# Patient Record
Sex: Male | Born: 1965
Health system: Southern US, Community
[De-identification: ages and names within clinical notes are randomized; demographics above are authoritative.]

## PROBLEM LIST (undated history)

## (undated) DIAGNOSIS — E785 Hyperlipidemia, unspecified: Secondary | ICD-10-CM

## (undated) DIAGNOSIS — I10 Essential (primary) hypertension: Secondary | ICD-10-CM

## (undated) DIAGNOSIS — E669 Obesity, unspecified: Secondary | ICD-10-CM

## (undated) HISTORY — DX: Essential (primary) hypertension: I10

## (undated) HISTORY — DX: Hyperlipidemia, unspecified: E78.5

## (undated) HISTORY — PX: TRIGGER FINGER RELEASE: SHX641

## (undated) HISTORY — PX: KNEE SURGERY: SHX244

## (undated) HISTORY — DX: Obesity, unspecified: E66.9

## (undated) HISTORY — PX: CARPAL TUNNEL RELEASE: SHX101

---

## 2013-06-17 ENCOUNTER — Ambulatory Visit (INDEPENDENT_AMBULATORY_CARE_PROVIDER_SITE_OTHER): Payer: BC Managed Care – PPO | Admitting: Cardiovascular Disease

## 2013-06-17 ENCOUNTER — Encounter: Payer: Self-pay | Admitting: *Deleted

## 2013-06-17 ENCOUNTER — Encounter: Payer: Self-pay | Admitting: Cardiovascular Disease

## 2013-06-17 ENCOUNTER — Other Ambulatory Visit: Payer: Self-pay | Admitting: *Deleted

## 2013-06-17 ENCOUNTER — Encounter (INDEPENDENT_AMBULATORY_CARE_PROVIDER_SITE_OTHER): Payer: Self-pay

## 2013-06-17 VITALS — BP 161/95 | HR 99 | Ht 66.0 in | Wt 278.2 lb

## 2013-06-17 DIAGNOSIS — E785 Hyperlipidemia, unspecified: Secondary | ICD-10-CM

## 2013-06-17 DIAGNOSIS — I77810 Thoracic aortic ectasia: Secondary | ICD-10-CM

## 2013-06-17 DIAGNOSIS — I10 Essential (primary) hypertension: Secondary | ICD-10-CM

## 2013-06-17 MED ORDER — AMLODIPINE BESYLATE 5 MG PO TABS: 5.0000 mg | ORAL_TABLET | Freq: Every day | ORAL | Status: DC

## 2013-06-17 MED ORDER — LISINOPRIL-HYDROCHLOROTHIAZIDE 20-25 MG PO TABS: 1.0000 | ORAL_TABLET | Freq: Every day | ORAL | Status: DC

## 2013-06-17 NOTE — Telephone Encounter (Signed)
Requested Prescriptions   Signed Prescriptions Disp Refills  . lisinopril-hydrochlorothiazide (PRINZIDE,ZESTORETIC) 20-25 MG per tablet 90 tablet 3    Sig: Take 1 tablet by mouth daily.    Authorizing Provider: Kathlyn Sacramento A    Ordering User: Britt Bottom

## 2013-06-17 NOTE — Patient Instructions (Signed)
Your physician has recommended you make the following change in your medication:  Start Amlodipine 5 mg daily    Your physician recommends that you schedule a follow-up appointment in:  3 months

## 2013-06-20 ENCOUNTER — Encounter: Payer: Self-pay | Admitting: Cardiovascular Disease

## 2013-06-20 DIAGNOSIS — E785 Hyperlipidemia, unspecified: Secondary | ICD-10-CM | POA: Insufficient documentation

## 2013-06-20 DIAGNOSIS — I77810 Thoracic aortic ectasia: Secondary | ICD-10-CM | POA: Insufficient documentation

## 2013-06-20 DIAGNOSIS — I10 Essential (primary) hypertension: Secondary | ICD-10-CM | POA: Insufficient documentation

## 2013-06-20 NOTE — Assessment & Plan Note (Signed)
He will require a fasting lipid and liver profile this year. Continue with lifestyle changes.

## 2013-06-20 NOTE — Assessment & Plan Note (Signed)
Blood pressure is more elevated than before possibly due to weight gain and increase physical activities. I discussed with him the importance of low sodium diet, exercise and weight loss. I recommend stopping amlodipine 5 mg once daily. He denies symptoms of sleep apnea.

## 2013-06-20 NOTE — Progress Notes (Signed)
HPI  This is a pleasant 48 year old man who is here today to establish cardiovascular care regarding management of hypertension. He moved from [place]. He saw Dr. Shelle Iron who is a cardiologist in [place]. He was treated with lisinopril hydrochlorothiazide with improvement in blood pressure. He had an echocardiogram done which showed normal LV systolic function, mild left ventricular hypertrophy, mildly dilated aortic root and mild mitral regurgitation. It was mentioned that the descending aorta was moderately dilated at 4.1 cm. I reviewed his labs from June of 2014 which showed a total cholesterol of 213, triglyceride of 90, LDL of 141 and HDL of 54. Kidney function was normal. Liver profile was normal. He denies any chest pain or dyspnea. He had a treadmill stress test in 2005 which was normal. He gained 8 pounds since he moved. He does not smoke and drinks occasional alcohol. He denies excessive use of nonsteroidal anti-inflammatory medications. He works as an Print production planner.  No Known Allergies   No current outpatient prescriptions on file prior to visit.   No current facility-administered medications on file prior to visit.     Past Medical History  Diagnosis Date  . Hypertension   . Obesity      Past Surgical History  Procedure Laterality Date  . Knee surgery Right      Family History  Problem Relation Age of Onset  . Hypertension Mother   . Hypertension Father      History   Social History  . Marital Status: Married    Spouse Name: N/A    Number of Children: N/A  . Years of Education: N/A   Occupational History  . Not on file.   Social History Main Topics  . Smoking status: Never Smoker   . Smokeless tobacco: Not on file  . Alcohol Use: Yes     Comment: socially  . Drug Use: No  . Sexual Activity: Not on file   Other Topics Concern  . Not on file   Social History Narrative  . No narrative on file     ROS A 10 point review of system was  performed. It is negative other than that mentioned in the history of present illness.   PHYSICAL EXAM   BP 161/95  Pulse 99  Ht 5\' 6"  (1.676 m)  Wt 278 lb 4 oz (126.213 kg)  BMI 44.93 kg/m2 Constitutional: He is oriented to person, place, and time. He appears well-developed and well-nourished. No distress.  HENT: No nasal discharge.  Head: Normocephalic and atraumatic.  Eyes: Pupils are equal and round.  No discharge. Neck: Normal range of motion. Neck supple. No JVD present. No thyromegaly present.  Cardiovascular: Normal rate, regular rhythm, normal heart sounds. Exam reveals no gallop and no friction rub. No murmur heard.  Pulmonary/Chest: Effort normal and breath sounds normal. No stridor. No respiratory distress. He has no wheezes. He has no rales. He exhibits no tenderness.  Abdominal: Soft. Bowel sounds are normal. He exhibits no distension. There is no tenderness. There is no rebound and no guarding.  Musculoskeletal: Normal range of motion. He exhibits no edema and no tenderness.  Neurological: He is alert and oriented to person, place, and time. Coordination normal.  Skin: Skin is warm and dry. No rash noted. He is not diaphoretic. No erythema. No pallor.  Psychiatric: He has a normal mood and affect. His behavior is normal. Judgment and thought content normal.       EKG: Normal sinus rhythm with prolonged QT.  ASSESSMENT AND PLAN

## 2013-06-20 NOTE — Assessment & Plan Note (Signed)
This was noted by echocardiogram in the past. I will consider a followup echocardiogram to evaluate this.

## 2013-09-16 ENCOUNTER — Encounter: Payer: Self-pay | Admitting: Cardiovascular Disease

## 2013-09-16 ENCOUNTER — Ambulatory Visit (INDEPENDENT_AMBULATORY_CARE_PROVIDER_SITE_OTHER): Payer: BC Managed Care – PPO | Admitting: Cardiovascular Disease

## 2013-09-16 VITALS — BP 146/94 | HR 72 | Ht 65.5 in | Wt 284.5 lb

## 2013-09-16 DIAGNOSIS — I77810 Thoracic aortic ectasia: Secondary | ICD-10-CM

## 2013-09-16 DIAGNOSIS — E785 Hyperlipidemia, unspecified: Secondary | ICD-10-CM

## 2013-09-16 DIAGNOSIS — I10 Essential (primary) hypertension: Secondary | ICD-10-CM

## 2013-09-16 NOTE — Assessment & Plan Note (Signed)
I requested fasting lipid and liver profile. Check basic metabolic profile as well given that he is on lisinopril and hydrochlorothiazide.

## 2013-09-16 NOTE — Assessment & Plan Note (Signed)
Blood pressure improved significantly after the addition of amlodipine. I had a prolonged discussion with him again about the importance of weight loss and regular exercise. He did not resume his exercise program since he moved.  Continue same medications for now.

## 2013-09-16 NOTE — Progress Notes (Signed)
HPI  This is a pleasant 48 year old man who is here today for a followup visit regarding hypertension and mildly dilated aortic root. He moved from [place]. He saw Dr. Shelle Iron who is a cardiologist in [place]. He was treated with lisinopril hydrochlorothiazide with improvement in blood pressure. He had an echocardiogram done which showed normal LV systolic function, mild left ventricular hypertrophy, mildly dilated aortic root and mild mitral regurgitation. It was mentioned that the ascending aorta was moderately dilated at 4.1 cm. labs from June of 2014  showed a total cholesterol of 213, triglyceride of 90, LDL of 141 and HDL of 54. Kidney function was normal. Liver profile was normal. He denies any chest pain or dyspnea.  He does not smoke and drinks occasional alcohol. He denies excessive use of nonsteroidal anti-inflammatory medications. He works as an Print production planner. During last visit, I added amlodipine for blood pressure control. Home blood pressure readings improved significantly to the normal range. Unfortunately, he continues to gain weight.  No Known Allergies   Current Outpatient Prescriptions on File Prior to Visit  Medication Sig Dispense Refill  . amLODipine (NORVASC) 5 MG tablet Take 1 tablet (5 mg total) by mouth daily.  90 tablet  3  . lisinopril-hydrochlorothiazide (PRINZIDE,ZESTORETIC) 20-25 MG per tablet Take 1 tablet by mouth daily.  90 tablet  3   No current facility-administered medications on file prior to visit.     Past Medical History  Diagnosis Date  . Obesity   . Hypertension   . Hyperlipidemia      Past Surgical History  Procedure Laterality Date  . Knee surgery Right      Family History  Problem Relation Age of Onset  . Hypertension Mother   . Hypertension Father      History   Social History  . Marital Status: Married    Spouse Name: N/A    Number of Children: N/A  . Years of Education: N/A   Occupational History  . Not on  file.   Social History Main Topics  . Smoking status: Never Smoker   . Smokeless tobacco: Not on file  . Alcohol Use: Yes     Comment: socially  . Drug Use: No  . Sexual Activity: Not on file   Other Topics Concern  . Not on file   Social History Narrative  . No narrative on file     ROS A 10 point review of system was performed. It is negative other than that mentioned in the history of present illness.   PHYSICAL EXAM   BP 146/94  Pulse 72  Ht 5' 5.5" (1.664 m)  Wt 284 lb 8 oz (129.048 kg)  BMI 46.61 kg/m2 Constitutional: He is oriented to person, place, and time. He appears well-developed and well-nourished. No distress.  HENT: No nasal discharge.  Head: Normocephalic and atraumatic.  Eyes: Pupils are equal and round.  No discharge. Neck: Normal range of motion. Neck supple. No JVD present. No thyromegaly present.  Cardiovascular: Normal rate, regular rhythm, normal heart sounds. Exam reveals no gallop and no friction rub. No murmur heard.  Pulmonary/Chest: Effort normal and breath sounds normal. No stridor. No respiratory distress. He has no wheezes. He has no rales. He exhibits no tenderness.  Abdominal: Soft. Bowel sounds are normal. He exhibits no distension. There is no tenderness. There is no rebound and no guarding.  Musculoskeletal: Normal range of motion. He exhibits no edema and no tenderness.  Neurological: He is alert and  oriented to person, place, and time. Coordination normal.  Skin: Skin is warm and dry. No rash noted. He is not diaphoretic. No erythema. No pallor.  Psychiatric: He has a normal mood and affect. His behavior is normal. Judgment and thought content normal.       ASSESSMENT AND PLAN

## 2013-09-16 NOTE — Assessment & Plan Note (Signed)
This was noted by echocardiogram in the past. I recommend an echocardiogram later this year.

## 2013-09-16 NOTE — Patient Instructions (Signed)
Your physician recommends that you return for lab work in:  Make appt for any morning this week or next week for fasting lipid and complete metabolic panel.   Your physician wants you to follow-up in: 6 months with Dr. Fletcher Anon. You will receive a reminder letter in the mail two months in advance. If you don't receive a letter, please call our office to schedule the follow-up appointment.

## 2013-09-19 ENCOUNTER — Ambulatory Visit (INDEPENDENT_AMBULATORY_CARE_PROVIDER_SITE_OTHER): Payer: BC Managed Care – PPO

## 2013-09-19 DIAGNOSIS — I10 Essential (primary) hypertension: Secondary | ICD-10-CM

## 2013-09-19 DIAGNOSIS — E785 Hyperlipidemia, unspecified: Secondary | ICD-10-CM

## 2014-03-17 ENCOUNTER — Ambulatory Visit (INDEPENDENT_AMBULATORY_CARE_PROVIDER_SITE_OTHER): Payer: BLUE CROSS/BLUE SHIELD | Admitting: Cardiovascular Disease

## 2014-03-17 ENCOUNTER — Encounter: Payer: Self-pay | Admitting: Cardiovascular Disease

## 2014-03-17 VITALS — BP 138/94 | HR 99 | Ht 65.0 in | Wt 288.5 lb

## 2014-03-17 DIAGNOSIS — R0600 Dyspnea, unspecified: Secondary | ICD-10-CM

## 2014-03-17 DIAGNOSIS — I10 Essential (primary) hypertension: Secondary | ICD-10-CM

## 2014-03-17 DIAGNOSIS — G4730 Sleep apnea, unspecified: Secondary | ICD-10-CM

## 2014-03-17 DIAGNOSIS — E785 Hyperlipidemia, unspecified: Secondary | ICD-10-CM

## 2014-03-17 DIAGNOSIS — I77810 Thoracic aortic ectasia: Secondary | ICD-10-CM

## 2014-03-17 NOTE — Progress Notes (Signed)
   HPI  This is a pleasant 49 year old man who is here today for a followup visit regarding hypertension and mildly dilated aortic root. He moved from [place].  He had an echocardiogram done in 2011 which showed normal LV systolic function, mild left ventricular hypertrophy, mildly dilated aortic root and mild mitral regurgitation. It was mentioned that the ascending aorta was moderately dilated at 4.1 cm. He does not smoke and drinks occasional alcohol.  He has been doing well and denies any chest pain. He has chronic exertional dyspnea. He was told by his wife that his breathing stops at night. He feels tired during the day. He has not been evaluated for sleep apnea.   No Known Allergies   Current Outpatient Prescriptions on File Prior to Visit  Medication Sig Dispense Refill  . amLODipine (NORVASC) 5 MG tablet Take 1 tablet (5 mg total) by mouth daily. 90 tablet 3  . lisinopril-hydrochlorothiazide (PRINZIDE,ZESTORETIC) 20-25 MG per tablet Take 1 tablet by mouth daily. 90 tablet 3   No current facility-administered medications on file prior to visit.     Past Medical History  Diagnosis Date  . Obesity   . Hypertension   . Hyperlipidemia      Past Surgical History  Procedure Laterality Date  . Knee surgery Right      Family History  Problem Relation Age of Onset  . Hypertension Mother   . Hypertension Father      History   Social History  . Marital Status: Married    Spouse Name: N/A    Number of Children: N/A  . Years of Education: N/A   Occupational History  . Not on file.   Social History Main Topics  . Smoking status: Never Smoker   . Smokeless tobacco: Not on file  . Alcohol Use: Yes     Comment: socially  . Drug Use: No  . Sexual Activity: Not on file   Other Topics Concern  . Not on file   Social History Narrative     ROS A 10 point review of system was performed. It is negative other than that mentioned in the history of present  illness.   PHYSICAL EXAM   BP 138/94 mmHg  Pulse 99  Ht 5\' 5"  (1.651 m)  Wt 288 lb 8 oz (130.863 kg)  BMI 48.01 kg/m2 Constitutional: He is oriented to person, place, and time. He appears well-developed and well-nourished. No distress.  HENT: No nasal discharge.  Head: Normocephalic and atraumatic.  Eyes: Pupils are equal and round.  No discharge. Neck: Normal range of motion. Neck supple. No JVD present. No thyromegaly present.  Cardiovascular: Normal rate, regular rhythm, normal heart sounds. Exam reveals no gallop and no friction rub. No murmur heard.  Pulmonary/Chest: Effort normal and breath sounds normal. No stridor. No respiratory distress. He has no wheezes. He has no rales. He exhibits no tenderness.  Abdominal: Soft. Bowel sounds are normal. He exhibits no distension. There is no tenderness. There is no rebound and no guarding.  Musculoskeletal: Normal range of motion. He exhibits no edema and no tenderness.  Neurological: He is alert and oriented to person, place, and time. Coordination normal.  Skin: Skin is warm and dry. No rash noted. He is not diaphoretic. No erythema. No pallor.  Psychiatric: He has a normal mood and affect. His behavior is normal. Judgment and thought content normal.       ASSESSMENT AND PLAN

## 2014-03-17 NOTE — Assessment & Plan Note (Signed)
Blood pressure is borderline elevated but he did not take his medications today. Continue current medications without changes. He probably has underlying sleep apnea which is contributing to his hypertension.

## 2014-03-17 NOTE — Patient Instructions (Signed)
Your physician has requested that you have an echocardiogram. Echocardiography is a painless test that uses sound waves to create images of your heart. It provides your doctor with information about the size and shape of your heart and how well your heart's chambers and valves are working. This procedure takes approximately one hour. There are no restrictions for this procedure.  Refer to  pulmonary in [place] for evaluation of sleep apnea.   Continue same medications.   Your physician wants you to follow-up in: 6 months.  You will receive a reminder letter in the mail two months in advance. If you don't receive a letter, please call our office to schedule the follow-up appointment.

## 2014-03-17 NOTE — Assessment & Plan Note (Signed)
Continue with blood pressure control. I requested an echocardiogram for follow-up.

## 2014-03-17 NOTE — Assessment & Plan Note (Signed)
Lipid profile showed an LDL of 134. I discussed with him the importance of diet and lifestyle changes.

## 2014-03-17 NOTE — Assessment & Plan Note (Signed)
Patient has symptoms highly suggestive of sleep apnea which is likely contributing to his high blood pressure. I referred him to pulmonary for evaluation.

## 2014-04-01 ENCOUNTER — Other Ambulatory Visit: Payer: BLUE CROSS/BLUE SHIELD

## 2014-04-01 ENCOUNTER — Other Ambulatory Visit: Payer: Self-pay

## 2014-04-01 ENCOUNTER — Other Ambulatory Visit (INDEPENDENT_AMBULATORY_CARE_PROVIDER_SITE_OTHER): Payer: BLUE CROSS/BLUE SHIELD

## 2014-04-01 DIAGNOSIS — I77810 Thoracic aortic ectasia: Secondary | ICD-10-CM

## 2014-04-01 DIAGNOSIS — R0600 Dyspnea, unspecified: Secondary | ICD-10-CM

## 2014-04-24 ENCOUNTER — Ambulatory Visit (INDEPENDENT_AMBULATORY_CARE_PROVIDER_SITE_OTHER): Payer: BLUE CROSS/BLUE SHIELD | Admitting: Pulmonary Disease

## 2014-04-24 ENCOUNTER — Encounter: Payer: Self-pay | Admitting: Pulmonary Disease

## 2014-04-24 VITALS — BP 122/84 | HR 111 | Temp 98.3°F | Ht 65.5 in | Wt 296.0 lb

## 2014-04-24 DIAGNOSIS — R0683 Snoring: Secondary | ICD-10-CM

## 2014-04-24 NOTE — Progress Notes (Deleted)
   Subjective:    Patient ID: Tracy Lin, male    DOB: August 20, 1965, 49 y.o.   MRN: [id number]  HPI    Review of Systems  Constitutional: Negative for fever and unexpected weight change.  HENT: Negative for congestion, dental problem, ear pain, nosebleeds, postnasal drip, rhinorrhea, sinus pressure, sneezing, sore throat and trouble swallowing.   Eyes: Negative for redness and itching.  Respiratory: Negative for cough, chest tightness, shortness of breath and wheezing.   Cardiovascular: Negative for palpitations and leg swelling.  Gastrointestinal: Negative for nausea and vomiting.  Genitourinary: Negative for dysuria.  Musculoskeletal: Negative for joint swelling.  Skin: Negative for rash.  Neurological: Negative for headaches.  Hematological: Does not bruise/bleed easily.  Psychiatric/Behavioral: Negative for dysphoric mood. The patient is not nervous/anxious.        Objective:   Physical Exam        Assessment & Plan:

## 2014-04-24 NOTE — Progress Notes (Signed)
Chief Complaint  Patient presents with  . SLEEP CONSULT    Referred by Dr Fletcher Anon.    History of Present Illness: Tracy Lin is a 49 y.o. male for evaluation of sleep problems.  He is followed by cardiology for hypertension.  He snores, and has been told by his wife that he stops breathing while asleep.  He was advised to have further sleep evaluation.  He sleeps on his side.  His mouth will get dry sometimes at night.  He can fall asleep if he is watching a boring TV show.  He goes to sleep at 11 pm.  He falls asleep quickly.  He wakes up 1 time to use the bathroom.  He gets out of bed at 750 am.  He feels okay in the morning.  He denies morning headache.  He does not use anything to help him fall sleep.  He drinks one cup of coffee in the morning.  He denies sleep walking, sleep talking, bruxism, or nightmares.  There is no history of restless legs.  He denies sleep hallucinations, sleep paralysis, or cataplexy.  The Epworth score is 13 out of 24.  Tests: Echo 04/01/14 >> EF 65 to 64%, grade 1 diastolic dysfx  Tracy Lin  has a past medical history of Obesity; Hypertension; and Hyperlipidemia.  Tracy Lin  has past surgical history that includes Knee surgery (Right).  Prior to Admission medications   Medication Sig Start Date End Date Taking? Authorizing Provider  amLODipine (NORVASC) 5 MG tablet Take 1 tablet (5 mg total) by mouth daily. 06/17/13  Yes Wellington Hampshire, MD  lisinopril-hydrochlorothiazide (PRINZIDE,ZESTORETIC) 20-25 MG per tablet Take 1 tablet by mouth daily. 06/17/13  Yes Wellington Hampshire, MD    No Known Allergies  His family history includes Hypertension in his father and mother.  He  reports that he has never smoked. He does not have any smokeless tobacco history on file. He reports that he drinks alcohol. He reports that he does not use illicit drugs.  Review of Systems  Constitutional: Negative for fever and unexpected weight change.  HENT:  Negative for congestion, dental problem, ear pain, nosebleeds, postnasal drip, rhinorrhea, sinus pressure, sneezing, sore throat and trouble swallowing.   Eyes: Negative for redness and itching.  Respiratory: Negative for cough, chest tightness, shortness of breath and wheezing.   Cardiovascular: Negative for palpitations and leg swelling.  Gastrointestinal: Negative for nausea and vomiting.  Genitourinary: Negative for dysuria.  Musculoskeletal: Negative for joint swelling.  Skin: Negative for rash.  Neurological: Negative for headaches.  Hematological: Does not bruise/bleed easily.  Psychiatric/Behavioral: Negative for dysphoric mood. The patient is not nervous/anxious.    Physical Exam: Blood pressure 122/84, pulse 111, temperature 98.3 F (36.8 C), temperature source Oral, height 5' 5.5" (1.664 m), weight 296 lb (134.265 kg), SpO2 99 %. Body mass index is 48.49 kg/(m^2).  General - No distress ENT - No sinus tenderness, no oral exudate, no LAN, no thyromegaly, TM clear, pupils equal/reactive, MP 4, scalloped tongue, 2+ tonsils, elongated uvula Cardiac - s1s2 regular, no murmur, pulses symmetric Chest - No wheeze/rales/dullness, good air entry, normal respiratory excursion Back - No focal tenderness Abd - Soft, non-tender, no organomegaly, + bowel sounds Ext - No edema Neuro - Normal strength, cranial nerves intact Skin - No rashes Psych - Normal mood, and behavior  Discussion: He has snoring, sleep disruption, witnessed apnea, and daytime sleepiness.  He has hx of refractory hypertension.  His BMI  is > 35.  I am concerned he could have sleep apnea.  We discussed how sleep apnea can affect various health problems including risks for hypertension, cardiovascular disease, and diabetes.  We also discussed how sleep disruption can increase risks for accident, such as while driving.  Weight loss as a means of improving sleep apnea was also reviewed.  Additional treatment options  discussed were CPAP therapy, oral appliance, and surgical intervention.  Assessment/plan:  Snoring. Plan: - will arrange for home sleep study pending insurance approval   Chesley Mires, M.D. Pager [phone]

## 2014-04-24 NOTE — Patient Instructions (Signed)
Will arrange for home sleep study Will call to arrange for follow up after sleep study reviewed  

## 2014-07-19 ENCOUNTER — Other Ambulatory Visit: Payer: Self-pay | Admitting: Cardiovascular Disease

## 2014-09-02 ENCOUNTER — Telehealth: Payer: Self-pay | Admitting: Pulmonary Disease

## 2014-09-02 NOTE — Telephone Encounter (Signed)
Pt was contacted to arrange HST back in March 2016," pt refused to schedule HST, stated that he wanted to get a second opinion and that he feels like he does not need sleep test".  Pt was also advised by Alida that the current [employer] authorization would expire 05/25/14.  Returned patient call and advised him that we did call to arrange HST, however, he refused to set HST up at that time. Pt advised that the authorization with [employer] has expired and that we will have to start this process over.   Pt stated that he did want to follow through with the hst now. [employer] authorization form given to Dr. Halford Chessman to complete and sign. Once prior authorization has been obtained, pt advised that we will call him to arrange HST. Advised patient to allow up 2 wks for the approval process and that we will contact him once we have the approval to schedule. Pt voiced understanding. Nothing else needed at this time. Rhonda J Cobb

## 2014-09-02 NOTE — Telephone Encounter (Signed)
Please advise PCC's regarding r/s HST. thanks

## 2014-09-18 DIAGNOSIS — G4730 Sleep apnea, unspecified: Secondary | ICD-10-CM | POA: Diagnosis not present

## 2014-09-26 ENCOUNTER — Telehealth: Payer: Self-pay | Admitting: Pulmonary Disease

## 2014-09-26 DIAGNOSIS — G4730 Sleep apnea, unspecified: Secondary | ICD-10-CM | POA: Diagnosis not present

## 2014-09-26 NOTE — Telephone Encounter (Signed)
HST 09/18/14 >> AHI 30.4, SaO2 low 76%  Will have my nurse inform pt that sleep study shows severe sleep apnea.  Options are 1) CPAP now, 2) ROV first.  If He is agreeable to CPAP, then please send order for auto CPAP range 5 to 15 cm H2O with heated humidity and mask of choice.  Have download sent 1 month after starting CPAP and set up ROV 2 months after starting CPAP.

## 2014-09-29 ENCOUNTER — Other Ambulatory Visit: Payer: Self-pay | Admitting: *Deleted

## 2014-09-29 DIAGNOSIS — R0683 Snoring: Secondary | ICD-10-CM

## 2014-10-01 NOTE — Telephone Encounter (Signed)
Patient scheduled to see Dr. Halford Chessman on Monday to discuss treatment options. Patient wants to know a "ball park" of how much a CPAP will cost?  [place] - Can you help with this?  Do you know how much CPAP's run?

## 2014-10-01 NOTE — Telephone Encounter (Signed)
LMTCB x 1 

## 2014-10-01 NOTE — Telephone Encounter (Signed)
About $1500 but his bcbs should pay 80% maybe depending if he has met his deductable and then he will have supplies but bcbs usually pays 80% Joellen Jersey

## 2014-10-01 NOTE — Telephone Encounter (Signed)
Pt aware. Nothing further needed 

## 2014-10-01 NOTE — Telephone Encounter (Signed)
Pt returned call [phone]

## 2014-10-06 ENCOUNTER — Encounter: Payer: Self-pay | Admitting: Pulmonary Disease

## 2014-10-06 ENCOUNTER — Ambulatory Visit (INDEPENDENT_AMBULATORY_CARE_PROVIDER_SITE_OTHER): Payer: BLUE CROSS/BLUE SHIELD | Admitting: Pulmonary Disease

## 2014-10-06 VITALS — BP 138/88 | HR 98 | Ht 65.0 in | Wt 293.8 lb

## 2014-10-06 DIAGNOSIS — G4733 Obstructive sleep apnea (adult) (pediatric): Secondary | ICD-10-CM | POA: Diagnosis not present

## 2014-10-06 NOTE — Progress Notes (Signed)
Chief Complaint  Patient presents with  . Follow-up    History of Present Illness: Tracy Lin is a 49 y.o. male with OSA.  He is here to review his sleep study.  This showed severe sleep apnea.  He is not sure the test results are accurate.  He does not feel like he slept much while doing the home sleep study.  He thinks the pulse oximeter probable was not connected correctly.  He also had to sleep on his back during home sleep study, but usually sleeps on his side or his stomach.    TESTS: Echo 04/01/14 >> EF 65 to 54%, grade 1 diastolic dysfx HST 2/70/62 >> AHI 30.4, SaO2 low 76%  Past medical hx >> HTN, HLD  Past surgical hx, Medications, Allergies, Family hx, Social hx all reviewed.   Physical Exam: BP 138/88 mmHg  Pulse 98  Ht 5\' 5"  (1.651 m)  Wt 293 lb 12.8 oz (133.267 kg)  BMI 48.89 kg/m2  SpO2 98%  General - No distress ENT - No sinus tenderness, no oral exudate, no LAN, MP 4, scallloped tongue, 2+ tonsils, elongated uvula Cardiac - s1s2 regular, no murmur Chest - No wheeze/rales/dullness Back - No focal tenderness Abd - Soft, non-tender Ext - No edema Neuro - Normal strength Skin - No rashes Psych - normal mood, and behavior  Discussion: His home sleep study showed severe sleep apnea.  He is not sure how accurate home sleep study was, and is not convinced he has sleep apnea.  I explained to him that more accurate test would then be to arrange for in lab, monitor sleep study.  This would avoid any issues of whether he slept during study, and also tech could then troubleshoot any issues with test equipment.   Assessment/Plan:  Obstructive sleep apnea. Plan: - will arrange for in lab sleep study to further assess whether in fact he does have sleep apnea  Obesity. Plan: - discussed options to assist with weight loss   Chesley Mires, MD [facility] Pager:  [phone]

## 2014-10-06 NOTE — Patient Instructions (Signed)
Will arrange for in lab sleep study  Follow up in 3 months 

## 2014-11-18 ENCOUNTER — Encounter: Payer: Self-pay | Admitting: Cardiovascular Disease

## 2014-11-18 ENCOUNTER — Ambulatory Visit (INDEPENDENT_AMBULATORY_CARE_PROVIDER_SITE_OTHER): Payer: BLUE CROSS/BLUE SHIELD | Admitting: Cardiovascular Disease

## 2014-11-18 ENCOUNTER — Ambulatory Visit: Payer: BLUE CROSS/BLUE SHIELD | Admitting: Cardiovascular Disease

## 2014-11-18 VITALS — BP 142/88 | HR 87 | Ht 66.0 in | Wt 285.1 lb

## 2014-11-18 DIAGNOSIS — I77810 Thoracic aortic ectasia: Secondary | ICD-10-CM

## 2014-11-18 DIAGNOSIS — I10 Essential (primary) hypertension: Secondary | ICD-10-CM

## 2014-11-18 DIAGNOSIS — G4730 Sleep apnea, unspecified: Secondary | ICD-10-CM | POA: Diagnosis not present

## 2014-11-18 DIAGNOSIS — E785 Hyperlipidemia, unspecified: Secondary | ICD-10-CM | POA: Diagnosis not present

## 2014-11-18 NOTE — Patient Instructions (Signed)
Medication Instructions: Continue same medications.   Labwork: None.   Procedures/Testing: None.   Follow-Up: 1 year with Dr. Arida  Any Additional Special Instructions Will Be Listed Below (If Applicable).   

## 2014-11-18 NOTE — Assessment & Plan Note (Signed)
A repeat sleep study was ordered and still pending. I encouraged the patient to follow-up with this.

## 2014-11-18 NOTE — Assessment & Plan Note (Signed)
He reports that his blood pressure is actually better controlled at home. Continue same medications.

## 2014-11-18 NOTE — Assessment & Plan Note (Signed)
This was stable on most recent echocardiogram in February.

## 2014-11-18 NOTE — Progress Notes (Signed)
HPI  This is a pleasant 49 year old man who is here today for a followup visit regarding hypertension and mildly dilated aortic root. He moved from [place].  He had an echocardiogram done in 2011 which showed normal LV systolic function, mild left ventricular hypertrophy, mildly dilated aortic root and mild mitral regurgitation. It was mentioned that the ascending aorta was moderately dilated at 4.1 cm. He does not smoke and drinks occasional alcohol.  He had an echocardiogram done in our office in February 2016 which showed normal LV systolic function, grade 1 diastolic dysfunction and mildly dilated aortic root at 40 mm. He had a home sleep study done which showed severe sleep apnea. However, the patient is not convinced that he has sleep apnea. He reports that he did not sleep well that night. He has been exercising regularly. He goes to the gym. He feels better than before. No chest pain or shortness of breath.   No Known Allergies   Current Outpatient Prescriptions on File Prior to Visit  Medication Sig Dispense Refill  . amLODipine (NORVASC) 5 MG tablet TAKE 1 TABLET BY MOUTH DAILY. 90 tablet 3  . lisinopril-hydrochlorothiazide (PRINZIDE,ZESTORETIC) 20-25 MG per tablet TAKE 1 TABLET BY MOUTH DAILY. 90 tablet 3   No current facility-administered medications on file prior to visit.     Past Medical History  Diagnosis Date  . Obesity   . Hypertension   . Hyperlipidemia      Past Surgical History  Procedure Laterality Date  . Knee surgery Right      Family History  Problem Relation Age of Onset  . Hypertension Mother   . Hypertension Father      Social History   Social History  . Marital Status: Married    Spouse Name: N/A  . Number of Children: N/A  . Years of Education: N/A   Occupational History  . IT    Social History Main Topics  . Smoking status: Never Smoker   . Smokeless tobacco: Not on file  . Alcohol Use: 0.0 oz/week    0 Standard drinks or  equivalent per week     Comment: socially -  1-2 approx 3 x week  . Drug Use: No  . Sexual Activity: Not on file   Other Topics Concern  . Not on file   Social History Narrative     ROS A 10 point review of system was performed. It is negative other than that mentioned in the history of present illness.   PHYSICAL EXAM   BP 142/88 mmHg  Pulse 87  Ht 5\' 6"  (1.676 m)  Wt 285 lb 2 oz (129.332 kg)  BMI 46.04 kg/m2 Constitutional: He is oriented to person, place, and time. He appears well-developed and well-nourished. No distress.  HENT: No nasal discharge.  Head: Normocephalic and atraumatic.  Eyes: Pupils are equal and round.  No discharge. Neck: Normal range of motion. Neck supple. No JVD present. No thyromegaly present.  Cardiovascular: Normal rate, regular rhythm, normal heart sounds. Exam reveals no gallop and no friction rub. No murmur heard.  Pulmonary/Chest: Effort normal and breath sounds normal. No stridor. No respiratory distress. He has no wheezes. He has no rales. He exhibits no tenderness.  Abdominal: Soft. Bowel sounds are normal. He exhibits no distension. There is no tenderness. There is no rebound and no guarding.  Musculoskeletal: Normal range of motion. He exhibits no edema and no tenderness.  Neurological: He is alert and oriented to person, place, and time.  Coordination normal.  Skin: Skin is warm and dry. No rash noted. He is not diaphoretic. No erythema. No pallor.  Psychiatric: He has a normal mood and affect. His behavior is normal. Judgment and thought content normal.   EKG: Normal sinus rhythm with no significant ST or T wave changes.   ASSESSMENT AND PLAN

## 2014-11-23 ENCOUNTER — Ambulatory Visit (HOSPITAL_BASED_OUTPATIENT_CLINIC_OR_DEPARTMENT_OTHER): Payer: BLUE CROSS/BLUE SHIELD

## 2014-12-29 ENCOUNTER — Telehealth: Payer: Self-pay | Admitting: *Deleted

## 2014-12-29 ENCOUNTER — Other Ambulatory Visit: Payer: Self-pay | Admitting: *Deleted

## 2014-12-29 MED ORDER — LISINOPRIL-HYDROCHLOROTHIAZIDE 20-25 MG PO TABS: 1.0000 | ORAL_TABLET | Freq: Every day | ORAL | Status: DC

## 2014-12-29 MED ORDER — AMLODIPINE BESYLATE 5 MG PO TABS: 5.0000 mg | ORAL_TABLET | Freq: Every day | ORAL | Status: DC

## 2014-12-29 NOTE — Telephone Encounter (Signed)
Rx sent to local pharmacy.  

## 2014-12-29 NOTE — Telephone Encounter (Signed)
*  STAT* If patient is at the pharmacy, call can be transferred to refill team.   1. Which medications need to be refilled? (please list name of each medication and dose if known) amLODipine (NORVASC) 5 MG tablet  And linopril-hydrochlorothiazide (PRINZIDE,ZESTORETIC) 20-25 MG per tablet   2. Which pharmacy/location (including street and city if local pharmacy) is medication to be sent to? [facility]  3. Do they need a 30 day or 90 day supply? 90 day

## 2015-07-09 DIAGNOSIS — M65332 Trigger finger, left middle finger: Secondary | ICD-10-CM | POA: Diagnosis not present

## 2016-02-01 DIAGNOSIS — Z23 Encounter for immunization: Secondary | ICD-10-CM | POA: Diagnosis not present

## 2016-02-19 ENCOUNTER — Other Ambulatory Visit: Payer: Self-pay | Admitting: Cardiovascular Disease

## 2016-03-01 ENCOUNTER — Encounter: Payer: Self-pay | Admitting: Cardiovascular Disease

## 2016-03-01 ENCOUNTER — Ambulatory Visit (INDEPENDENT_AMBULATORY_CARE_PROVIDER_SITE_OTHER): Payer: BLUE CROSS/BLUE SHIELD | Admitting: Cardiovascular Disease

## 2016-03-01 VITALS — BP 120/82 | HR 85 | Ht 66.0 in | Wt 275.2 lb

## 2016-03-01 DIAGNOSIS — I10 Essential (primary) hypertension: Secondary | ICD-10-CM

## 2016-03-01 DIAGNOSIS — I77810 Thoracic aortic ectasia: Secondary | ICD-10-CM | POA: Diagnosis not present

## 2016-03-01 NOTE — Patient Instructions (Addendum)
Medication Instructions:  Your physician recommends that you continue on your current medications as directed. Please refer to the Current Medication list given to you today.   Labwork: none  Testing/Procedures: none  Follow-Up: Your physician wants you to follow-up in: one year with Dr. Fletcher Anon. You will receive a reminder letter in the mail two months in advance. If you don't receive a letter, please call our office to schedule the follow-up appointment.   Any Other Special Instructions Will Be Listed Below (If Applicable). [place] [facility] [place], [place]  Allie Bossier, NP Friday, January 19, 8:00am arrival       If you need a refill on your cardiac medications before your next appointment, please call your pharmacy.

## 2016-03-01 NOTE — Progress Notes (Signed)
Cardiology Office Note   Date:  03/01/2016   ID:  Tracy Lin, DOB Jul 22, 1965, MRN [id number]  PCP:  No PCP Per Patient  Cardiologist:   Kathlyn Sacramento, MD   Chief Complaint  Patient presents with  . other    12 month follow up. Meds reviewed by the pt. verbally. "doing well."       History of Present Illness: Tracy Lin is a 51 y.o. male who presents for a followup visit regarding hypertension and mildly dilated aortic root. He moved from [place].  He had an echocardiogram done in 2011 which showed normal LV systolic function, mild left ventricular hypertrophy, mildly dilated aortic root (4.1 cm) and mild mitral regurgitation. He does not smoke and drinks occasional alcohol.  He had an echocardiogram done in our office in February 2016 which showed normal LV systolic function, grade 1 diastolic dysfunction and mildly dilated aortic root at 40 mm.  He had a home sleep study done which showed severe sleep apnea.  However, the patient had improvement in symptoms with exercise and weight loss. He has been doing very well and denies any chest pain, shortness of breath or palpitations. He goes to the gym 5 times a week with no exertional symptoms. He lost 18 pounds over the last 1-1/2 year.  Past Medical History:  Diagnosis Date  . Hyperlipidemia   . Hypertension   . Obesity     Past Surgical History:  Procedure Laterality Date  . KNEE SURGERY Right      Current Outpatient Prescriptions  Medication Sig Dispense Refill  . amLODipine (NORVASC) 5 MG tablet TAKE 1 TABLET BY MOUTH DAILY. 90 tablet 0  . lisinopril-hydrochlorothiazide (PRINZIDE,ZESTORETIC) 20-25 MG tablet TAKE 1 TABLET BY MOUTH DAILY. 90 tablet 0   No current facility-administered medications for this visit.     Allergies:   Patient has no known allergies.    Social History:  The patient  reports that he has never smoked. He has never used smokeless tobacco. He reports that he drinks alcohol. He  reports that he does not use drugs.   Family History:  The patient's family history includes Hypertension in his father and mother.    ROS:  Please see the history of present illness.   Otherwise, review of systems are positive for none.   All other systems are reviewed and negative.    PHYSICAL EXAM: VS:  BP 120/82 (BP Location: Left Arm, Patient Position: Sitting, Cuff Size: Large)   Pulse 85   Ht 5\' 6"  (1.676 m)   Wt 275 lb 4 oz (124.9 kg)   BMI 44.43 kg/m  , BMI Body mass index is 44.43 kg/m. GEN: Well nourished, well developed, in no acute distress  HEENT: normal  Neck: no JVD, carotid bruits, or masses Cardiac: RRR; no murmurs, rubs, or gallops,no edema  Respiratory:  clear to auscultation bilaterally, normal work of breathing GI: soft, nontender, nondistended, + BS MS: no deformity or atrophy  Skin: warm and dry, no rash Neuro:  Strength and sensation are intact Psych: euthymic mood, full affect   EKG:  EKG is ordered today. The ekg ordered today demonstrates normal sinus rhythm with no significant ST or T wave changes.   Recent Labs: No results found for requested labs within last 8760 hours.    Lipid Panel No results found for: CHOL, TRIG, HDL, CHOLHDL, VLDL, LDLCALC, LDLDIRECT    Wt Readings from Last 3 Encounters:  03/01/16 275 lb 4 oz (  124.9 kg)  11/18/14 285 lb 2 oz (129.3 kg)  10/06/14 293 lb 12.8 oz (133.3 kg)       No flowsheet data found.    ASSESSMENT AND PLAN:  1.  Essential hypertension: Blood pressure is under excellent control on current medications.  2. Mildly dilated aortic root: I recommend repeat echocardiogram in one year for evaluation.    Disposition:   FU with me in 1 year. I am referring the patient to establish with a primary care physician.  Signed,  Kathlyn Sacramento, MD  03/01/2016 3:09 PM    [facility]

## 2016-03-11 ENCOUNTER — Encounter: Payer: Self-pay | Admitting: Primary Care

## 2016-03-11 ENCOUNTER — Ambulatory Visit (INDEPENDENT_AMBULATORY_CARE_PROVIDER_SITE_OTHER): Payer: BLUE CROSS/BLUE SHIELD | Admitting: Primary Care

## 2016-03-11 ENCOUNTER — Ambulatory Visit: Payer: BLUE CROSS/BLUE SHIELD | Admitting: Primary Care

## 2016-03-11 VITALS — BP 118/84 | HR 84 | Temp 98.3°F | Ht 66.0 in | Wt 278.4 lb

## 2016-03-11 DIAGNOSIS — I10 Essential (primary) hypertension: Secondary | ICD-10-CM | POA: Diagnosis not present

## 2016-03-11 DIAGNOSIS — E785 Hyperlipidemia, unspecified: Secondary | ICD-10-CM

## 2016-03-11 LAB — COMPREHENSIVE METABOLIC PANEL
ALT: 15 U/L (ref 0–53)
AST: 17 U/L (ref 0–37)
Albumin: 3.8 g/dL (ref 3.5–5.2)
Alkaline Phosphatase: 66 U/L (ref 39–117)
BUN: 12 mg/dL (ref 6–23)
CALCIUM: 9.3 mg/dL (ref 8.4–10.5)
CO2: 27 mEq/L (ref 19–32)
CREATININE: 1 mg/dL (ref 0.40–1.50)
Chloride: 104 mEq/L (ref 96–112)
GFR: 102.52 mL/min (ref >60.00)
Glucose, Bld: 84 mg/dL (ref 70–99)
Potassium: 4.1 mEq/L (ref 3.5–5.1)
Sodium: 138 mEq/L (ref 135–145)
Total Bilirubin: 0.4 mg/dL (ref 0.2–1.2)
Total Protein: 7.5 g/dL (ref 6.0–8.3)

## 2016-03-11 NOTE — Patient Instructions (Signed)
Complete lab work prior to leaving today. I will notify you of your results once received.   Continue to work on improvements in your diet and regular exercise.  Continue exercising. You should be getting 150 minutes of moderate intensity exercise weekly.  Please schedule a physical with me within the next 3-6 months. You may also schedule a lab only appointment 3-4 days prior. We will discuss your lab results in detail during your physical.  It was a pleasure to meet you today! Please don't hesitate to call me with any questions. Welcome to [facility]!  DASH Eating Plan DASH stands for "Dietary Approaches to Stop Hypertension." The DASH eating plan is a healthy eating plan that has been shown to reduce high blood pressure (hypertension). Additional health benefits may include reducing the risk of type 2 diabetes mellitus, heart disease, and stroke. The DASH eating plan may also help with weight loss. What do I need to know about the DASH eating plan? For the DASH eating plan, you will follow these general guidelines:  Choose foods with less than 150 milligrams of sodium per serving (as listed on the food label).  Use salt-free seasonings or herbs instead of table salt or sea salt.  Check with your health care provider or pharmacist before using salt substitutes.  Eat lower-sodium products. These are often labeled as "low-sodium" or "no salt added."  Eat fresh foods. Avoid eating a lot of canned foods.  Eat more vegetables, fruits, and low-fat dairy products.  Choose whole grains. Look for the word "whole" as the first word in the ingredient list.  Choose fish and skinless chicken or [place] more often than red meat. Limit fish, poultry, and meat to 6 oz (170 g) each day.  Limit sweets, desserts, sugars, and sugary drinks.  Choose heart-healthy fats.  Eat more home-cooked food and less restaurant, buffet, and fast food.  Limit fried foods.  Do not fry foods. Cook foods using  methods such as baking, boiling, grilling, and broiling instead.  When eating at a restaurant, ask that your food be prepared with less salt, or no salt if possible. What foods can I eat? Seek help from a dietitian for individual calorie needs. Grains  Whole grain or whole wheat bread. Brown rice. Whole grain or whole wheat pasta. Quinoa, bulgur, and whole grain cereals. Low-sodium cereals. Corn or whole wheat flour tortillas. Whole grain cornbread. Whole grain crackers. Low-sodium crackers. Vegetables  Fresh or frozen vegetables (raw, steamed, roasted, or grilled). Low-sodium or reduced-sodium tomato and vegetable juices. Low-sodium or reduced-sodium tomato sauce and paste. Low-sodium or reduced-sodium canned vegetables. Fruits  All fresh, canned (in natural juice), or frozen fruits. Meat and Other Protein Products  Ground beef (85% or leaner), grass-fed beef, or beef trimmed of fat. Skinless chicken or [place]. Ground chicken or [place]. Pork trimmed of fat. All fish and seafood. Eggs. Dried beans, peas, or lentils. Unsalted nuts and seeds. Unsalted canned beans. Dairy  Low-fat dairy products, such as skim or 1% milk, 2% or reduced-fat cheeses, low-fat ricotta or cottage cheese, or plain low-fat yogurt. Low-sodium or reduced-sodium cheeses. Fats and Oils  Tub margarines without trans fats. Light or reduced-fat mayonnaise and salad dressings (reduced sodium). Avocado. Safflower, olive, or canola oils. Natural peanut or almond butter. Other  Unsalted popcorn and pretzels. The items listed above may not be a complete list of recommended foods or beverages. Contact your dietitian for more options.  What foods are not recommended? Grains  White bread. White pasta.  White rice. Refined cornbread. Bagels and croissants. Crackers that contain trans fat. Vegetables  Creamed or fried vegetables. Vegetables in a cheese sauce. Regular canned vegetables. Regular canned tomato sauce and paste. Regular  tomato and vegetable juices. Fruits  Canned fruit in light or heavy syrup. Fruit juice. Meat and Other Protein Products  Fatty cuts of meat. Ribs, chicken wings, bacon, sausage, bologna, salami, chitterlings, fatback, hot dogs, bratwurst, and packaged luncheon meats. Salted nuts and seeds. Canned beans with salt. Dairy  Whole or 2% milk, cream, half-and-half, and cream cheese. Whole-fat or sweetened yogurt. Full-fat cheeses or blue cheese. Nondairy creamers and whipped toppings. Processed cheese, cheese spreads, or cheese curds. Condiments  Onion and garlic salt, seasoned salt, table salt, and sea salt. Canned and packaged gravies. Worcestershire sauce. Tartar sauce. Barbecue sauce. Teriyaki sauce. Soy sauce, including reduced sodium. Steak sauce. Fish sauce. Oyster sauce. Cocktail sauce. Horseradish. Ketchup and mustard. Meat flavorings and tenderizers. Bouillon cubes. Hot sauce. Tabasco sauce. Marinades. Taco seasonings. Relishes. Fats and Oils  Butter, stick margarine, lard, shortening, ghee, and bacon fat. Coconut, palm kernel, or palm oils. Regular salad dressings. Other  Pickles and olives. Salted popcorn and pretzels. The items listed above may not be a complete list of foods and beverages to avoid. Contact your dietitian for more information.  Where can I find more information? National Heart, Lung, and Blood Institute: travelstabloid.com This information is not intended to replace advice given to you by your health care provider. Make sure you discuss any questions you have with your health care provider. Document Released: 01/27/2011 Document Revised: 07/16/2015 Document Reviewed: 12/12/2012 Elsevier Interactive Patient Education  2017 [employer].

## 2016-03-11 NOTE — Assessment & Plan Note (Signed)
Was notified that he did not have sleep apnea. Again, recommended weight loss.

## 2016-03-11 NOTE — Progress Notes (Signed)
Pre visit review using our clinic review tool, if applicable. No additional management support is needed unless otherwise documented below in the visit note. 

## 2016-03-11 NOTE — Assessment & Plan Note (Signed)
Last lipid panel on file from 2015 with slight elevation. Check lipids when fasting at upcoming physical. Encouraged healthy diet and regular exercise.

## 2016-03-11 NOTE — Assessment & Plan Note (Signed)
Stable in the office toady. Continue Amlodipine and Lisinopril-HCTZ. Continue healthy lifestyle changes. Check BMP today.

## 2016-03-11 NOTE — Progress Notes (Signed)
Subjective:    Patient ID: Tracy Lin, male    DOB: 1965/11/01, 51 y.o.   MRN: [id number]  HPI  Tracy Lin is a 51 year old male who presents today to establish care and discuss the problems mentioned below. Will obtain old records.  1) Essential Hypertension: Diagnosed 5 years ago. Currently managed on lisinopril-HCTZ 20/25 mg, and amlodipine 5 mg. His BP in the office today is 118/84. He checks his BP at home and gets readings of 120's/80's on average. He's trying to work on improvements in his diet and he's been exercising.  Wt Readings from Last 3 Encounters:  03/11/16 278 lb 6.4 oz (126.3 kg)  03/01/16 275 lb 4 oz (124.9 kg)  11/18/14 285 lb 2 oz (129.3 kg)   He denies chest pain, visual changes, shortness of breath. He currently follows with cardiology annually for a dilated aortic root.   2) Hyperlipidemia: He was told in the past that his cholesterol was slightly too high. Lipid panel last checked in 2015. TC of 205 and LDL of 134. He is not fasting today.  Review of Systems  Constitutional: Negative for fatigue.  Eyes: Negative for visual disturbance.  Respiratory: Negative for shortness of breath.   Cardiovascular: Negative for chest pain.  Neurological: Negative for dizziness and headaches.       Past Medical History:  Diagnosis Date  . Hyperlipidemia   . Hypertension   . Obesity      Social History   Social History  . Marital status: Married    Spouse name: N/A  . Number of children: N/A  . Years of education: N/A   Occupational History  . IT    Social History Main Topics  . Smoking status: Never Smoker  . Smokeless tobacco: Never Used  . Alcohol use 0.0 oz/week     Comment: socially -  1-2 approx 3 x week  . Drug use: No  . Sexual activity: Not on file   Other Topics Concern  . Not on file   Social History Narrative   Married.   2 children.   Works for [employer].   Enjoys camping, fishing, being outdoors.       Past Surgical History:    Procedure Laterality Date  . KNEE SURGERY Right     Family History  Problem Relation Age of Onset  . Hypertension Mother   . Colon cancer Mother   . Hypertension Father   . Prostate cancer Father   . Diabetes Father     No Known Allergies  Current Outpatient Prescriptions on File Prior to Visit  Medication Sig Dispense Refill  . amLODipine (NORVASC) 5 MG tablet TAKE 1 TABLET BY MOUTH DAILY. 90 tablet 0  . lisinopril-hydrochlorothiazide (PRINZIDE,ZESTORETIC) 20-25 MG tablet TAKE 1 TABLET BY MOUTH DAILY. 90 tablet 0   No current facility-administered medications on file prior to visit.     BP 118/84   Pulse 84   Temp 98.3 F (36.8 C) (Oral)   Ht 5\' 6"  (1.676 m)   Wt 278 lb 6.4 oz (126.3 kg)   SpO2 98%   BMI 44.93 kg/m    Objective:   Physical Exam  Constitutional: He is oriented to person, place, and time. He appears well-nourished.  Neck: Neck supple.  Cardiovascular: Normal rate and regular rhythm.   Pulmonary/Chest: Effort normal and breath sounds normal. He has no wheezes. He has no rales.  Neurological: He is alert and oriented to person, place, and time.  Skin: Skin is warm and dry.  Psychiatric: He has a normal mood and affect.          Assessment & Plan:

## 2016-04-27 DIAGNOSIS — G5601 Carpal tunnel syndrome, right upper limb: Secondary | ICD-10-CM | POA: Diagnosis not present

## 2016-07-27 ENCOUNTER — Other Ambulatory Visit: Payer: Self-pay | Admitting: Cardiovascular Disease

## 2017-09-08 ENCOUNTER — Ambulatory Visit: Payer: BLUE CROSS/BLUE SHIELD | Admitting: Cardiovascular Disease

## 2017-09-08 ENCOUNTER — Encounter: Payer: Self-pay | Admitting: Cardiovascular Disease

## 2017-09-08 VITALS — BP 148/104 | HR 90 | Ht 65.5 in | Wt 286.5 lb

## 2017-09-08 DIAGNOSIS — I10 Essential (primary) hypertension: Secondary | ICD-10-CM

## 2017-09-08 DIAGNOSIS — I77810 Thoracic aortic ectasia: Secondary | ICD-10-CM | POA: Diagnosis not present

## 2017-09-08 MED ORDER — LISINOPRIL-HYDROCHLOROTHIAZIDE 20-25 MG PO TABS: 1.0000 | ORAL_TABLET | Freq: Every day | ORAL | 3 refills | Status: DC

## 2017-09-08 NOTE — Patient Instructions (Signed)
Medication Instructions: STOP the Amlodipine.  If you need a refill on your cardiac medications before your next appointment, please call your pharmacy.    Follow-Up: Your physician wants you to follow-up in 12 months with Dr. Fletcher Anon. You will receive a reminder letter in the mail two months in advance. If you don't receive a letter, please call our office at [phone] to schedule this follow-up appointment.   Thank you for choosing [facility] at [facility]!

## 2017-09-08 NOTE — Progress Notes (Signed)
Cardiology Office Note   Date:  09/08/2017   ID:  Tracy Lin, DOB 1965/05/15, MRN [id number]  PCP:  Pleas Koch, NP  Cardiologist:   Kathlyn Sacramento, MD   Chief Complaint  Patient presents with  . OTHER    12 month f/u no complaints today. Meds reviewed verbally with pt.      History of Present Illness: Tracy Lin is a 52 y.o. male who presents for a followup visit regarding hypertension and mildly dilated aortic root. He   He had an echocardiogram done in 2011 which showed normal LV systolic function, mild left ventricular hypertrophy, mildly dilated aortic root (4.1 cm) and mild mitral regurgitation. He does not smoke and drinks occasional alcohol.  He had an echocardiogram done in our office in February 2016 which showed normal LV systolic function, grade 1 diastolic dysfunction and mildly dilated aortic root at 40 mm.  He had a home sleep study done which showed severe sleep apnea.  However, the patient had improvement in symptoms with exercise and weight loss.   He injured his right knee with exercise and has not been able to do as much as before.  He gained some weight back.  He stopped his antihypertensive medications 1 week ago to see if he needs them.  Blood pressure is significantly elevated today.  He reports better readings at home.  His wife is a Marine scientist.  He denies any chest pain or shortness of breath.  Past Medical History:  Diagnosis Date  . Hyperlipidemia   . Hypertension   . Obesity     Past Surgical History:  Procedure Laterality Date  . KNEE SURGERY Right      Current Outpatient Medications  Medication Sig Dispense Refill  . amLODipine (NORVASC) 5 MG tablet TAKE 1 TABLET BY MOUTH DAILY. 90 tablet 3  . lisinopril-hydrochlorothiazide (PRINZIDE,ZESTORETIC) 20-25 MG tablet TAKE 1 TABLET BY MOUTH DAILY. 90 tablet 3   No current facility-administered medications for this visit.     Allergies:   Patient has no known allergies.    Social  History:  The patient  reports that he has never smoked. He has never used smokeless tobacco. He reports that he drinks alcohol. He reports that he does not use drugs.   Family History:  The patient's family history includes Colon cancer in his mother; Diabetes in his father; Hypertension in his father and mother; Prostate cancer in his father.    ROS:  Please see the history of present illness.   Otherwise, review of systems are positive for none.   All other systems are reviewed and negative.    PHYSICAL EXAM: VS:  BP (!) 148/104 (BP Location: Left Arm, Patient Position: Sitting, Cuff Size: Large)   Pulse 90   Ht 5' 5.5" (1.664 m)   Wt 286 lb 8 oz (130 kg)   BMI 46.95 kg/m  , BMI Body mass index is 46.95 kg/m. GEN: Well nourished, well developed, in no acute distress  HEENT: normal  Neck: no JVD, carotid bruits, or masses Cardiac: RRR; no murmurs, rubs, or gallops,no edema  Respiratory:  clear to auscultation bilaterally, normal work of breathing GI: soft, nontender, nondistended, + BS MS: no deformity or atrophy  Skin: warm and dry, no rash Neuro:  Strength and sensation are intact Psych: euthymic mood, full affect   EKG:  EKG is ordered today. The ekg ordered today demonstrates normal sinus rhythm with no significant ST or T wave changes.  Recent Labs: No results found for requested labs within last 8760 hours.    Lipid Panel No results found for: CHOL, TRIG, HDL, CHOLHDL, VLDL, LDLCALC, LDLDIRECT    Wt Readings from Last 3 Encounters:  09/08/17 286 lb 8 oz (130 kg)  03/11/16 278 lb 6.4 oz (126.3 kg)  03/01/16 275 lb 4 oz (124.9 kg)       No flowsheet data found.    ASSESSMENT AND PLAN:  1.  Essential hypertension: Blood pressure is elevated today but he has not taken antihypertensive medications in the last week.  I elected to resume lisinopril hydrochlorothiazide without amlodipine.  The patient will need routine labs and he is due to have a complete  physical with his primary care physician.  He is going to schedule one in the near future.  2. Mildly dilated aortic root: This has remained stable at 40 to 41 mm over 4-5 years.  CTA of the aorta would be considered next year.  3.  Morbid obesity: I discussed with him the importance of resuming exercise.  He should consider water aerobics given his knee injury.  Disposition:   FU with me in 1 year.   Signed,  Kathlyn Sacramento, MD  09/08/2017 9:08 AM    [place]

## 2017-11-17 ENCOUNTER — Ambulatory Visit (INDEPENDENT_AMBULATORY_CARE_PROVIDER_SITE_OTHER): Payer: BLUE CROSS/BLUE SHIELD | Admitting: Primary Care

## 2017-11-17 VITALS — BP 140/86 | HR 89 | Temp 98.2°F | Ht 65.5 in | Wt 288.0 lb

## 2017-11-17 DIAGNOSIS — Z8042 Family history of malignant neoplasm of prostate: Secondary | ICD-10-CM

## 2017-11-17 DIAGNOSIS — E785 Hyperlipidemia, unspecified: Secondary | ICD-10-CM | POA: Diagnosis not present

## 2017-11-17 DIAGNOSIS — Z0000 Encounter for general adult medical examination without abnormal findings: Secondary | ICD-10-CM

## 2017-11-17 DIAGNOSIS — Z1211 Encounter for screening for malignant neoplasm of colon: Secondary | ICD-10-CM

## 2017-11-17 DIAGNOSIS — Z125 Encounter for screening for malignant neoplasm of prostate: Secondary | ICD-10-CM

## 2017-11-17 DIAGNOSIS — I10 Essential (primary) hypertension: Secondary | ICD-10-CM | POA: Diagnosis not present

## 2017-11-17 LAB — COMPREHENSIVE METABOLIC PANEL
ALBUMIN: 4.3 g/dL (ref 3.5–5.2)
ALK PHOS: 86 U/L (ref 39–117)
ALT: 19 U/L (ref 0–53)
AST: 16 U/L (ref 0–37)
BUN: 13 mg/dL (ref 6–23)
CHLORIDE: 101 meq/L (ref 96–112)
CO2: 30 mEq/L (ref 19–32)
Calcium: 9.4 mg/dL (ref 8.4–10.5)
Creatinine, Ser: 0.97 mg/dL (ref 0.40–1.50)
GFR: 105.44 mL/min (ref >60.00)
Glucose, Bld: 94 mg/dL (ref 70–99)
POTASSIUM: 3.7 meq/L (ref 3.5–5.1)
SODIUM: 137 meq/L (ref 135–145)
TOTAL PROTEIN: 7.9 g/dL (ref 6.0–8.3)
Total Bilirubin: 0.5 mg/dL (ref 0.2–1.2)

## 2017-11-17 LAB — PSA: PSA: 0.59 ng/mL (ref 0.10–4.00)

## 2017-11-17 LAB — LIPID PANEL
CHOLESTEROL: 204 mg/dL — AB (ref 0–200)
HDL: 56.4 mg/dL (ref >39.00)
LDL CALC: 133 mg/dL — AB (ref 0–99)
NONHDL: 147.51
Total CHOL/HDL Ratio: 4
Triglycerides: 75 mg/dL (ref 0.0–149.0)
VLDL: 15 mg/dL (ref 0.0–40.0)

## 2017-11-17 LAB — HEMOGLOBIN A1C: HEMOGLOBIN A1C: 5.7 % (ref 4.6–6.5)

## 2017-11-17 NOTE — Assessment & Plan Note (Signed)
Repeat lipid panel pending.  Discussed the importance of a healthy diet and regular exercise in order for weight loss, and to reduce the risk of any potential medical problems.  

## 2017-11-17 NOTE — Assessment & Plan Note (Signed)
Borderline in the office today, home readings are better.  Amlodipine removed from regimen by cardiology this Summer. Continue current regimen.

## 2017-11-17 NOTE — Assessment & Plan Note (Signed)
Td UTD, will get influenza vaccination at work. PSA pending.  Colonoscopy due this December, referral placed. Discussed the importance of a healthy diet and regular exercise in order for weight loss, and to reduce the risk of any potential medical problems. Exam unremarkable. Labs pending. Follow up in 1 year for CPE.

## 2017-11-17 NOTE — Progress Notes (Signed)
Subjective:    Patient ID: Tracy Lin, male    DOB: Nov 29, 1965, 52 y.o.   MRN: [id number]  HPI  Tracy Lin is a 52 year old male who presents today for complete physical.  He's checking his BP at home which is running low 130's/80's.   Immunizations: -Tetanus: Completed in 2017 -Influenza: Will get at work   Diet: He endorses a fair diet. Breakfast: Cereal  Lunch: Sandwich Dinner: Meat, vegetables, starch, casseroles  Snacks: Fruit, chips Desserts: 3-4 times weekly  Beverages: Water, occasional soda, beer or wine  Exercise: He is not exercising, some walking Eye exam: Completed 10 years ago Dental exam: Completes annually  Colonoscopy: Never completed, will be 52 in December   BP Readings from Last 3 Encounters:  11/17/17 140/86  09/08/17 (!) 148/104  03/11/16 118/84     Review of Systems  Constitutional: Negative for unexpected weight change.  HENT: Negative for rhinorrhea.   Respiratory: Negative for cough and shortness of breath.   Cardiovascular: Negative for chest pain.  Gastrointestinal: Negative for constipation and diarrhea.  Genitourinary: Negative for difficulty urinating.  Musculoskeletal: Negative for myalgias.       Left plantar foot pain  Skin: Negative for rash.  Allergic/Immunologic: Negative for environmental allergies.  Neurological: Negative for dizziness, numbness and headaches.  Psychiatric/Behavioral: The patient is not nervous/anxious.        Past Medical History:  Diagnosis Date  . Hyperlipidemia   . Hypertension   . Obesity      Social History   Socioeconomic History  . Marital status: Married    Spouse name: Not on file  . Number of children: Not on file  . Years of education: Not on file  . Highest education level: Not on file  Occupational History  . Occupation: IT  Social Needs  . Financial resource strain: Not on file  . Food insecurity:    Worry: Not on file    Inability: Not on file  . Transportation  needs:    Medical: Not on file    Non-medical: Not on file  Tobacco Use  . Smoking status: Never Smoker  . Smokeless tobacco: Never Used  Substance and Sexual Activity  . Alcohol use: Yes    Alcohol/week: 0.0 standard drinks    Comment: socially -  1-2 approx 3 x week  . Drug use: No  . Sexual activity: Not on file  Lifestyle  . Physical activity:    Days per week: Not on file    Minutes per session: Not on file  . Stress: Not on file  Relationships  . Social connections:    Talks on phone: Not on file    Gets together: Not on file    Attends religious service: Not on file    Active member of club or organization: Not on file    Attends meetings of clubs or organizations: Not on file    Relationship status: Not on file  . Intimate partner violence:    Fear of current or ex partner: Not on file    Emotionally abused: Not on file    Physically abused: Not on file    Forced sexual activity: Not on file  Other Topics Concern  . Not on file  Social History Narrative   Married.   2 children.   Works for [employer].   Enjoys camping, fishing, being outdoors.    Past Surgical History:  Procedure Laterality Date  . KNEE SURGERY Right  Family History  Problem Relation Age of Onset  . Hypertension Mother   . Colon cancer Mother   . Hypertension Father   . Prostate cancer Father   . Diabetes Father     No Known Allergies  Current Outpatient Medications on File Prior to Visit  Medication Sig Dispense Refill  . lisinopril-hydrochlorothiazide (PRINZIDE,ZESTORETIC) 20-25 MG tablet Take 1 tablet by mouth daily. 90 tablet 3   No current facility-administered medications on file prior to visit.     BP 140/86   Pulse 89   Temp 98.2 F (36.8 C) (Oral)   Ht 5' 5.5" (1.664 m)   Wt 288 lb (130.6 kg)   SpO2 98%   BMI 47.20 kg/m    Objective:   Physical Exam  Constitutional: He is oriented to person, place, and time. He appears well-nourished.  HENT:  Mouth/Throat: No  oropharyngeal exudate.  Eyes: Pupils are equal, round, and reactive to light. EOM are normal.  Neck: Neck supple. No thyromegaly present.  Cardiovascular: Normal rate and regular rhythm.  Respiratory: Effort normal and breath sounds normal.  GI: Soft. Bowel sounds are normal. There is no tenderness.  Genitourinary: Prostate normal. Prostate is not enlarged and not tender.  Genitourinary Comments: Chaperone present   Musculoskeletal: Normal range of motion.  Neurological: He is alert and oriented to person, place, and time.  Skin: Skin is warm and dry.  Psychiatric: He has a normal mood and affect.           Assessment & Plan:

## 2017-11-17 NOTE — Patient Instructions (Addendum)
Stop by the lab prior to leaving today. I will notify you of your results once received.   Start exercising. You should be getting 150 minutes of moderate intensity exercise weekly.  Continue to work on [employer]. Make sure to eat plenty of vegetables, fruit, whole grains, lean protein.   Ensure you are consuming 64 ounces of water daily.  You will be contacted regarding your referral to GI for the colonoscopy.  Please let us know if you have not been contacted within one week.   It was a pleasure to see you today!   Preventive Care 40-64 Years, Male Preventive care refers to lifestyle choices and visits with your health care provider that can promote health and wellness. What does preventive care include?  A yearly physical exam. This is also called an annual well check.  Dental exams once or twice a year.  Routine eye exams. Ask your health care provider how often you should have your eyes checked.  Personal lifestyle choices, including: ? Daily care of your teeth and gums. ? Regular physical activity. ? Eating a healthy diet. ? Avoiding tobacco and drug use. ? Limiting alcohol use. ? Practicing safe sex. ? Taking low-dose aspirin every day starting at age 31. What happens during an annual well check? The services and screenings done by your health care provider during your annual well check will depend on your age, overall health, lifestyle risk factors, and family history of disease. Counseling Your health care provider may ask you questions about your:  Alcohol use.  Tobacco use.  Drug use.  Emotional well-being.  Home and relationship well-being.  Sexual activity.  Eating habits.  Work and work Statistician.  Screening You may have the following tests or measurements:  Height, weight, and BMI.  Blood pressure.  Lipid and cholesterol levels. These may be checked every 5 years, or more frequently if you are over 69 years old.  Skin check.  Lung cancer  screening. You may have this screening every year starting at age 70 if you have a 30-pack-year history of smoking and currently smoke or have quit within the past 15 years.  Fecal occult blood test (FOBT) of the stool. You may have this test every year starting at age 107.  Flexible sigmoidoscopy or colonoscopy. You may have a sigmoidoscopy every 5 years or a colonoscopy every 10 years starting at age 70.  Prostate cancer screening. Recommendations will vary depending on your family history and other risks.  Hepatitis C blood test.  Hepatitis B blood test.  Sexually transmitted disease (STD) testing.  Diabetes screening. This is done by checking your blood sugar (glucose) after you have not eaten for a while (fasting). You may have this done every 1-3 years.  Discuss your test results, treatment options, and if necessary, the need for more tests with your health care provider. Vaccines Your health care provider may recommend certain vaccines, such as:  Influenza vaccine. This is recommended every year.  Tetanus, diphtheria, and acellular pertussis (Tdap, Td) vaccine. You may need a Td booster every 10 years.  Varicella vaccine. You may need this if you have not been vaccinated.  Zoster vaccine. You may need this after age 72.  Measles, mumps, and rubella (MMR) vaccine. You may need at least one dose of MMR if you were born in 1957 or later. You may also need a second dose.  Pneumococcal 13-valent conjugate (PCV13) vaccine. You may need this if you have certain conditions and have not  been vaccinated.  Pneumococcal polysaccharide (PPSV23) vaccine. You may need one or two doses if you smoke cigarettes or if you have certain conditions.  Meningococcal vaccine. You may need this if you have certain conditions.  Hepatitis A vaccine. You may need this if you have certain conditions or if you travel or work in places where you may be exposed to hepatitis A.  Hepatitis B vaccine. You  may need this if you have certain conditions or if you travel or work in places where you may be exposed to hepatitis B.  Haemophilus influenzae type b (Hib) vaccine. You may need this if you have certain risk factors.  Talk to your health care provider about which screenings and vaccines you need and how often you need them. This information is not intended to replace advice given to you by your health care provider. Make sure you discuss any questions you have with your health care provider. Document Released: 03/06/2015 Document Revised: 10/28/2015 Document Reviewed: 12/09/2014 Elsevier Interactive Patient Education  [employer].

## 2017-11-19 ENCOUNTER — Other Ambulatory Visit: Payer: Self-pay | Admitting: Primary Care

## 2017-11-19 DIAGNOSIS — E785 Hyperlipidemia, unspecified: Secondary | ICD-10-CM

## 2017-11-19 DIAGNOSIS — R7303 Prediabetes: Secondary | ICD-10-CM | POA: Insufficient documentation

## 2017-11-19 NOTE — Assessment & Plan Note (Signed)
Recent A1C of 5.7. Sent message for patient to work on diet and exercise.  Will repeat in 6 months.

## 2017-11-20 ENCOUNTER — Encounter: Payer: Self-pay | Admitting: *Deleted

## 2017-11-20 ENCOUNTER — Encounter: Payer: Self-pay | Admitting: Primary Care

## 2017-12-06 ENCOUNTER — Other Ambulatory Visit: Payer: Self-pay

## 2017-12-06 DIAGNOSIS — Z800 Family history of malignant neoplasm of digestive organs: Secondary | ICD-10-CM

## 2017-12-07 DIAGNOSIS — M6530 Trigger finger, unspecified finger: Secondary | ICD-10-CM | POA: Diagnosis not present

## 2017-12-18 ENCOUNTER — Telehealth: Payer: Self-pay

## 2017-12-18 NOTE — Telephone Encounter (Signed)
Patient contacted office stated he just found out yesterday that his insurance would not cover colonoscopy.  He is aware of the $100 cancellation fee and has agreed to pay it.  He will call the office back next year to schedule his screening colonoscopy.  Thanks [employer]

## 2017-12-19 ENCOUNTER — Ambulatory Visit: Admit: 2017-12-19 | Payer: BLUE CROSS/BLUE SHIELD | Admitting: Gastroenterology

## 2017-12-19 SURGERY — COLONOSCOPY WITH PROPOFOL
Anesthesia: General

## 2018-01-11 DIAGNOSIS — M65322 Trigger finger, left index finger: Secondary | ICD-10-CM | POA: Diagnosis not present

## 2018-03-27 DIAGNOSIS — M65331 Trigger finger, right middle finger: Secondary | ICD-10-CM | POA: Diagnosis not present

## 2018-04-05 DIAGNOSIS — M65331 Trigger finger, right middle finger: Secondary | ICD-10-CM | POA: Diagnosis not present

## 2018-11-01 ENCOUNTER — Encounter: Payer: Self-pay | Admitting: Cardiovascular Disease

## 2018-11-01 ENCOUNTER — Other Ambulatory Visit: Payer: Self-pay

## 2018-11-01 ENCOUNTER — Ambulatory Visit (INDEPENDENT_AMBULATORY_CARE_PROVIDER_SITE_OTHER): Payer: BC Managed Care – PPO | Admitting: Cardiovascular Disease

## 2018-11-01 ENCOUNTER — Other Ambulatory Visit
Admission: RE | Admit: 2018-11-01 | Discharge: 2018-11-01 | Disposition: A | Discharge status: Home / Self Care | Payer: BC Managed Care – PPO | Source: Ambulatory Visit | Attending: Cardiovascular Disease | Admitting: Cardiovascular Disease

## 2018-11-01 VITALS — BP 140/100 | HR 81 | Ht 66.0 in | Wt 293.8 lb

## 2018-11-01 DIAGNOSIS — I712 Thoracic aortic aneurysm, without rupture, unspecified: Secondary | ICD-10-CM

## 2018-11-01 DIAGNOSIS — I10 Essential (primary) hypertension: Secondary | ICD-10-CM

## 2018-11-01 DIAGNOSIS — Z0000 Encounter for general adult medical examination without abnormal findings: Secondary | ICD-10-CM

## 2018-11-01 DIAGNOSIS — E785 Hyperlipidemia, unspecified: Secondary | ICD-10-CM | POA: Diagnosis not present

## 2018-11-01 DIAGNOSIS — I7121 Aneurysm of the ascending aorta, without rupture: Secondary | ICD-10-CM

## 2018-11-01 LAB — HEPATIC FUNCTION PANEL
ALT: 17 U/L (ref 0–44)
AST: 19 U/L (ref 15–41)
Albumin: 3.9 g/dL (ref 3.5–5.0)
Alkaline Phosphatase: 71 U/L (ref 38–126)
Bilirubin, Direct: <0.1 mg/dL (ref 0.0–0.2)
Total Bilirubin: 0.5 mg/dL (ref 0.3–1.2)
Total Protein: 7.4 g/dL (ref 6.5–8.1)

## 2018-11-01 LAB — CBC WITH DIFFERENTIAL/PLATELET
Abs Immature Granulocytes: 0.01 10*3/uL (ref 0.00–0.07)
Basophils Absolute: 0 10*3/uL (ref 0.0–0.1)
Basophils Relative: 0 %
Eosinophils Absolute: 0.1 10*3/uL (ref 0.0–0.5)
Eosinophils Relative: 2 %
HCT: 37.9 % — ABNORMAL LOW (ref 39.0–52.0)
Hemoglobin: 12.5 g/dL — ABNORMAL LOW (ref 13.0–17.0)
Immature Granulocytes: 0 %
Lymphocytes Relative: 48 %
Lymphs Abs: 2.2 10*3/uL (ref 0.7–4.0)
MCH: 28.9 pg (ref 26.0–34.0)
MCHC: 33 g/dL (ref 30.0–36.0)
MCV: 87.7 fL (ref 80.0–100.0)
Monocytes Absolute: 0.4 10*3/uL (ref 0.1–1.0)
Monocytes Relative: 10 %
Neutro Abs: 1.8 10*3/uL (ref 1.7–7.7)
Neutrophils Relative %: 40 %
Platelets: 181 10*3/uL (ref 150–400)
RBC: 4.32 MIL/uL (ref 4.22–5.81)
RDW: 13 % (ref 11.5–15.5)
WBC: 4.5 10*3/uL (ref 4.0–10.5)
nRBC: 0 % (ref 0.0–0.2)

## 2018-11-01 LAB — BASIC METABOLIC PANEL
Anion gap: 7 (ref 5–15)
BUN: 13 mg/dL (ref 6–20)
CO2: 29 mmol/L (ref 22–32)
Calcium: 9 mg/dL (ref 8.9–10.3)
Chloride: 104 mmol/L (ref 98–111)
Creatinine, Ser: 1.01 mg/dL (ref 0.61–1.24)
GFR calc Af Amer: >60 mL/min (ref >60)
GFR calc non Af Amer: >60 mL/min (ref >60)
Glucose, Bld: 92 mg/dL (ref 70–99)
Potassium: 3.7 mmol/L (ref 3.5–5.1)
Sodium: 140 mmol/L (ref 135–145)

## 2018-11-01 LAB — LIPID PANEL
Cholesterol: 205 mg/dL — ABNORMAL HIGH (ref 0–200)
HDL: 57 mg/dL (ref >40)
LDL Cholesterol: 135 mg/dL — ABNORMAL HIGH (ref 0–99)
Total CHOL/HDL Ratio: 3.6 RATIO
Triglycerides: 64 mg/dL (ref <150)
VLDL: 13 mg/dL (ref 0–40)

## 2018-11-01 LAB — PSA: Prostatic Specific Antigen: 0.59 ng/mL (ref 0.00–4.00)

## 2018-11-01 NOTE — Progress Notes (Signed)
Cardiology Office Note   Date:  11/01/2018   ID:  Tracy Lin, DOB 1965/05/23, MRN [id number]  PCP:  Pleas Koch, NP  Cardiologist:   Kathlyn Sacramento, MD   Chief Complaint  Patient presents with  . other    12 month f/u complaints today. Meds reviewed verbally with pt.      History of Present Illness: Tracy Lin is a 53 y.o. male who presents for a followup visit regarding hypertension and mildly dilated aortic root. He   He had an echocardiogram done in 2011 which showed normal LV systolic function, mild left ventricular hypertrophy, mildly dilated aortic root (4.1 cm) and mild mitral regurgitation. He does not smoke and drinks occasional alcohol.  Most recent echocardiogram in February 2016 which showed normal LV systolic function, grade 1 diastolic dysfunction and mildly dilated aortic root at 40 mm.  Previous sleep study showed severe sleep apnea.  However, the patient had improvement in symptoms with exercise and weight loss.  He also could not afford the CPAP.  He has been doing reasonably well with no chest pain, shortness of breath or palpitations.  His blood pressure is mildly elevated today but reviewed home blood pressure readings and his blood pressure has been well controlled.  He has not been able to lose weight as he has not been able to exercise much.  He does not feel that sleep apnea is an issue at the present time.  Past Medical History:  Diagnosis Date  . Hyperlipidemia   . Hypertension   . Obesity     Past Surgical History:  Procedure Laterality Date  . KNEE SURGERY Right   . TRIGGER FINGER RELEASE Right      Current Outpatient Medications  Medication Sig Dispense Refill  . lisinopril-hydrochlorothiazide (PRINZIDE,ZESTORETIC) 20-25 MG tablet Take 1 tablet by mouth daily. 90 tablet 3   No current facility-administered medications for this visit.     Allergies:   Patient has no known allergies.    Social History:  The patient   reports that he has never smoked. He has never used smokeless tobacco. He reports current alcohol use. He reports that he does not use drugs.   Family History:  The patient's family history includes Colon cancer in his mother; Diabetes in his father; Hypertension in his father and mother; Prostate cancer in his father.    ROS:  Please see the history of present illness.   Otherwise, review of systems are positive for none.   All other systems are reviewed and negative.    PHYSICAL EXAM: VS:  BP (!) 140/100 (BP Location: Left Arm, Patient Position: Sitting, Cuff Size: Large)   Pulse 81   Ht 5\' 6"  (1.676 m)   Wt 293 lb 12 oz (133.2 kg)   SpO2 97%   BMI 47.41 kg/m  , BMI Body mass index is 47.41 kg/m. GEN: Well nourished, well developed, in no acute distress  HEENT: normal  Neck: no JVD, carotid bruits, or masses Cardiac: RRR; no murmurs, rubs, or gallops,no edema  Respiratory:  clear to auscultation bilaterally, normal work of breathing GI: soft, nontender, nondistended, + BS MS: no deformity or atrophy  Skin: warm and dry, no rash Neuro:  Strength and sensation are intact Psych: euthymic mood, full affect   EKG:  EKG is ordered today. The ekg ordered today demonstrates normal sinus rhythm with no significant ST or T wave changes.   Recent Labs: 11/17/2017: ALT 19; BUN 13; Creatinine,  Ser 0.97; Potassium 3.7; Sodium 137    Lipid Panel    Component Value Date/Time   CHOL 204 (H) 11/17/2017 1433   TRIG 75.0 11/17/2017 1433   HDL 56.40 11/17/2017 1433   CHOLHDL 4 11/17/2017 1433   VLDL 15.0 11/17/2017 1433   LDLCALC 133 (H) 11/17/2017 1433      Wt Readings from Last 3 Encounters:  11/01/18 293 lb 12 oz (133.2 kg)  11/17/17 288 lb (130.6 kg)  09/08/17 286 lb 8 oz (130 kg)       No flowsheet data found.    ASSESSMENT AND PLAN:  1.  Essential hypertension: Blood pressure is well controlled on lisinopril-hydrochlorothiazide.  It is mildly elevated today but home  blood pressure is excellent.  Continue same medications for now and we can consider adding amlodipine in the future if needed.  I also discussed with him the importance of healthy lifestyle changes, resuming exercise and attempting weight loss.  I requested routine labs on him today.  2. Mildly dilated aortic root: I requested CTA of the aorta for follow-up.    3.  Morbid obesity: I discussed with him the importance of resuming exercise.     Disposition:   FU with me in 1 year.   Signed,  Kathlyn Sacramento, MD  11/01/2018 3:21 PM    [place]

## 2018-11-01 NOTE — Patient Instructions (Signed)
Medication Instructions:  Your physician recommends that you continue on your current medications as directed. Please refer to the Current Medication list given to you today.  If you need a refill on your cardiac medications before your next appointment, please call your pharmacy.   Lab work: Art gallery manager, Psychologist, occupational, Lipid, Lft, PSA  Please have your lab work done at the [facility]. Stop at the desk to check in. You do not need an appointment.  If you have labs (blood work) drawn today and your tests are completely normal, you will receive your results only by: Marland Kitchen MyChart Message (if you have MyChart) OR . A paper copy in the mail If you have any lab test that is abnormal or we need to change your treatment, we will call you to review the results.  Testing/Procedures: Non-Cardiac CT Angiography (CTA), is a special type of CT scan that uses a computer to produce multi-dimensional views of major blood vessels throughout the body. In CT angiography, a contrast material is injected through an IV to help visualize the blood vessels   Please call [facility] to schedule [phone]  Follow-Up: At [facility], you and your health needs are our priority.  As part of our continuing mission to provide you with exceptional heart care, we have created designated Provider Care Teams.  These Care Teams include your primary Cardiologist (physician) and Advanced Practice Providers (APPs -  Physician Assistants and Nurse Practitioners) who all work together to provide you with the care you need, when you need it. You will need a follow up appointment in 12 months.  Please call our office 2 months in advance to schedule this appointment.  You may seeDr. Fletcher Anon or one of the following Advanced Practice Providers on your designated Care Team:   Murray Hodgkins, NP Christell Faith, PA-C . Marrianne Mood, PA-C  Any Other Special Instructions Will Be Listed Below (If Applicable). N/A

## 2018-11-02 ENCOUNTER — Telehealth: Payer: Self-pay

## 2018-11-02 NOTE — Telephone Encounter (Signed)
-----   Message from Wellington Hampshire, MD sent at 11/02/2018 12:16 PM EDT ----- Inform patient that labs were normal except for very mild anemia which he should follow-up with his primary care family care physician about.. Cholesterol remains mildly elevated.  I recommend adding atorvastatin 20 mg daily.  Recheck lipid and liver profile in 2 months.

## 2018-11-02 NOTE — Telephone Encounter (Signed)
Patient made aware of lab results and Dr. Tyrell Antonio recommendation. Patient sts thst he will need to think about whether or not he wants to start statin therapy at this time. Patient will call back to update Dr. Fletcher Anon on his decision.

## 2018-11-12 ENCOUNTER — Other Ambulatory Visit: Payer: Self-pay | Admitting: Cardiovascular Disease

## 2018-11-19 ENCOUNTER — Other Ambulatory Visit: Payer: Self-pay

## 2018-11-19 ENCOUNTER — Ambulatory Visit
Admission: RE | Admit: 2018-11-19 | Discharge: 2018-11-19 | Disposition: A | Discharge status: Home / Self Care | Payer: BC Managed Care – PPO | Source: Ambulatory Visit | Attending: Cardiovascular Disease | Admitting: Cardiovascular Disease

## 2018-11-19 DIAGNOSIS — I712 Thoracic aortic aneurysm, without rupture: Secondary | ICD-10-CM | POA: Diagnosis not present

## 2018-11-19 DIAGNOSIS — I7121 Aneurysm of the ascending aorta, without rupture: Secondary | ICD-10-CM

## 2018-11-19 IMAGING — CT CT ANGIO CHEST
3 of 6 series · 18 of 36 positions shown · IV contrast (omnipaque)
Comparison: None.

CLINICAL DATA: Ascending thoracic aortic prominence

EXAM:
CT ANGIOGRAPHY CHEST WITH CONTRAST
TECHNIQUE: Multidetector CT imaging of the chest was performed using the
standard protocol during bolus administration of intravenous
contrast. Multiplanar CT image reconstructions and MIPs were
obtained to evaluate the vascular anatomy.
CONTRAST:  75mL OMNIPAQUE IOHEXOL 350 MG/ML SOLN

[Series 4: axial arterial · axial · arterial · 0.69mm/px · z∈[-594,-324]mm · 10 of 111 slices shown]
[im 11/111  lung]
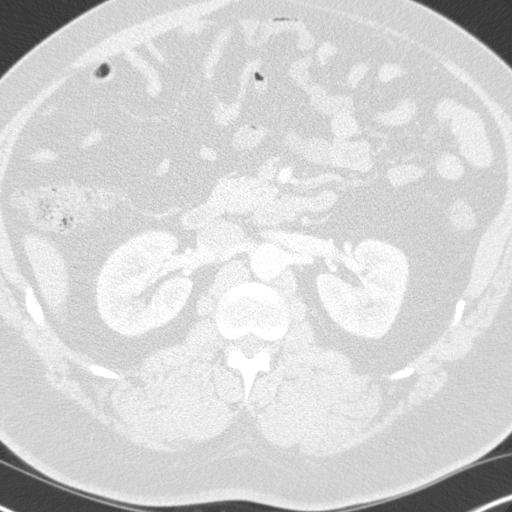
[im 21/111  mediastinal]
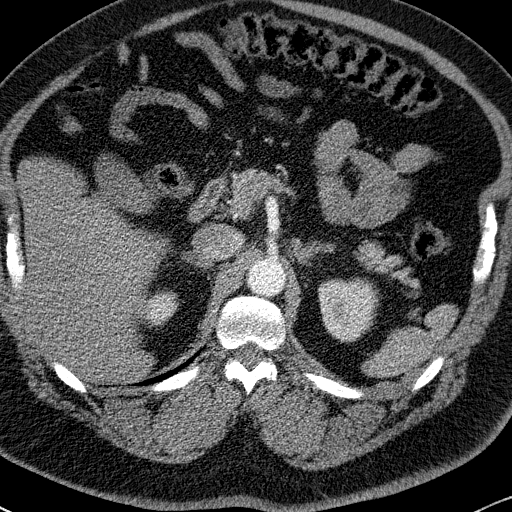
[im 31/111  lung]
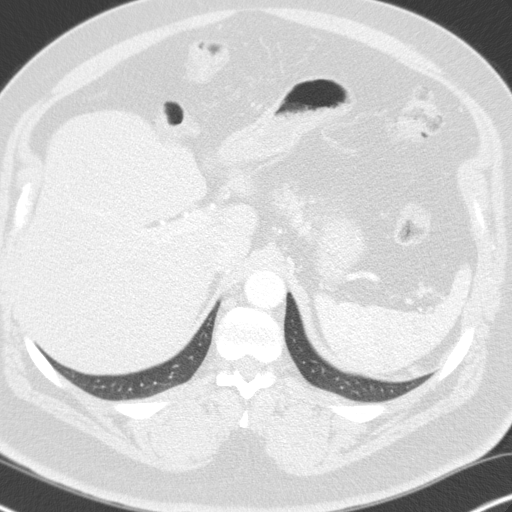
[im 41/111  mediastinal]
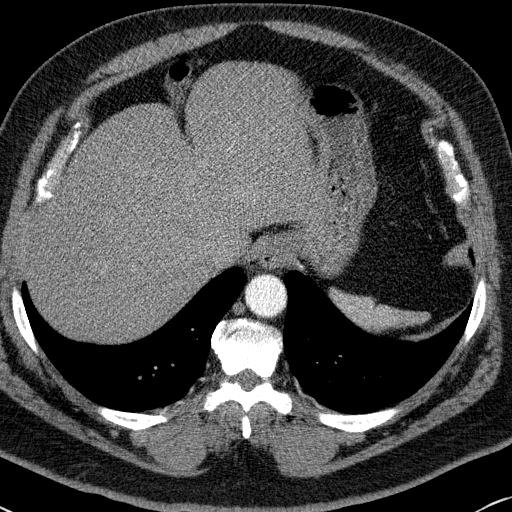
[im 51/111  lung]
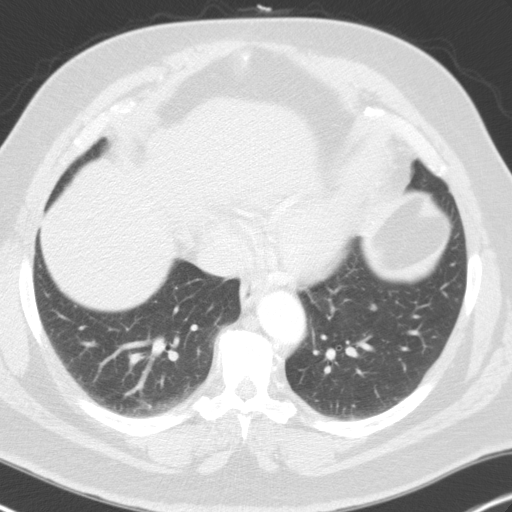
[im 61/111  mediastinal]
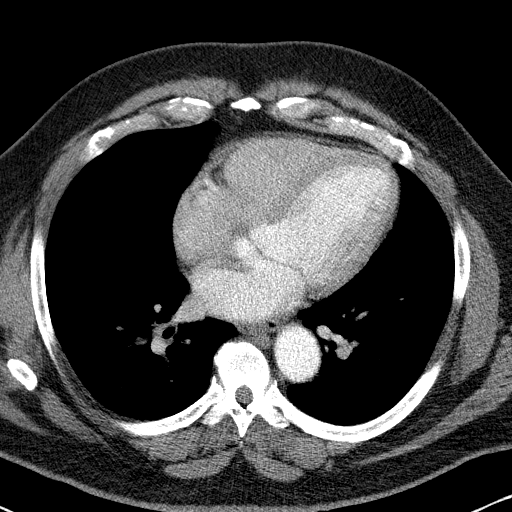
[im 71/111  lung]
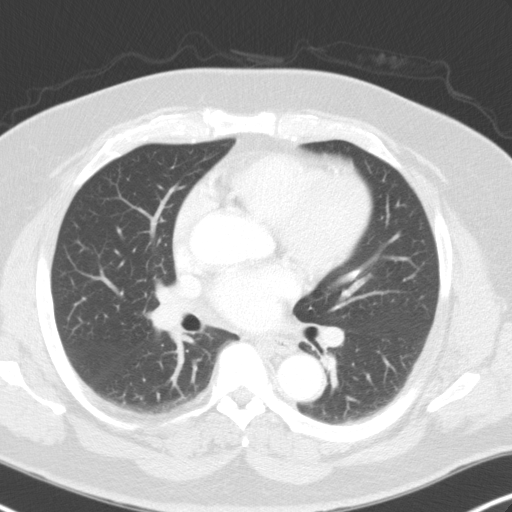
[im 81/111  mediastinal]
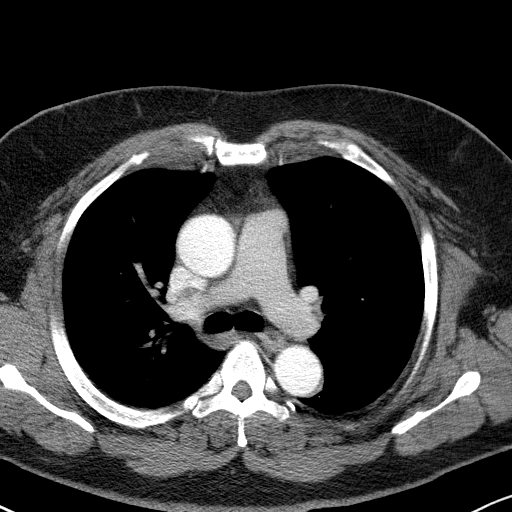
[im 91/111  lung]
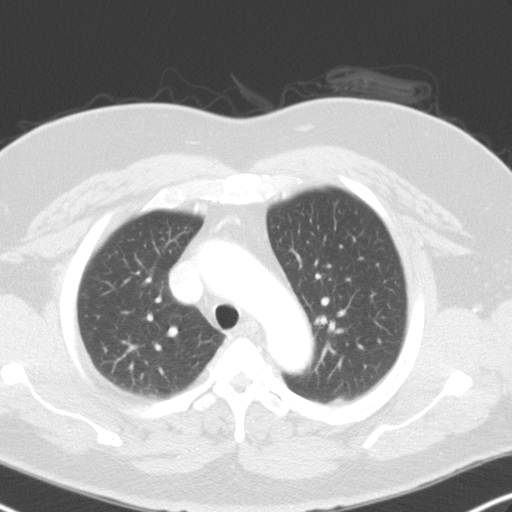
[im 101/111  mediastinal]
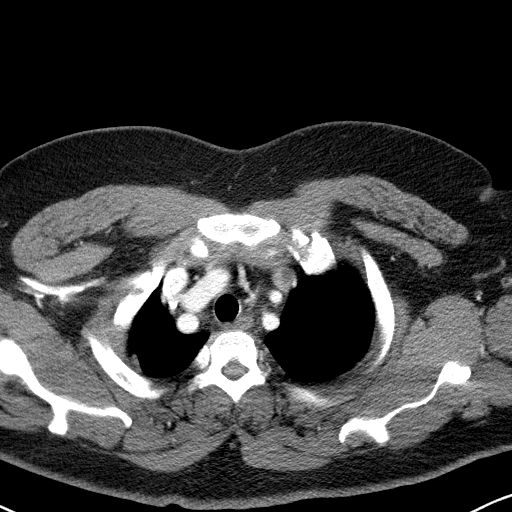

[Series 6: lung · axial · 0.69mm/px · z∈[-576,-356]mm · 7 of 152 slices shown]
[im 11/152  mediastinal]
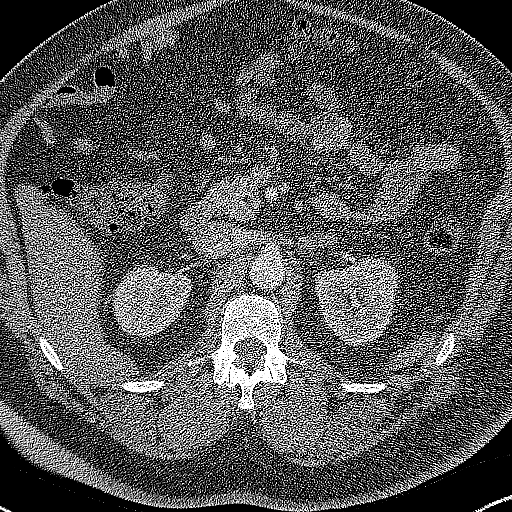
[im 31/152  mediastinal]
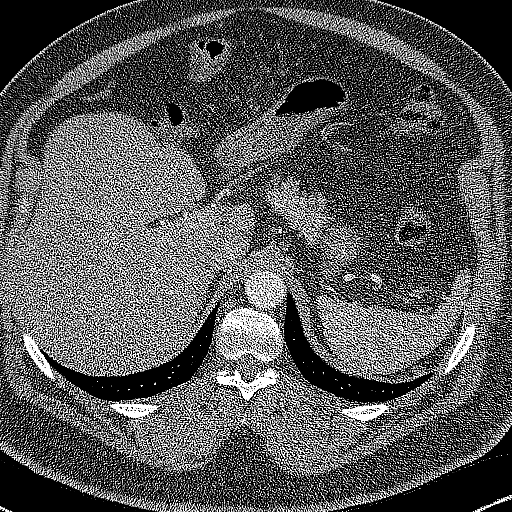
[im 51/152  mediastinal]
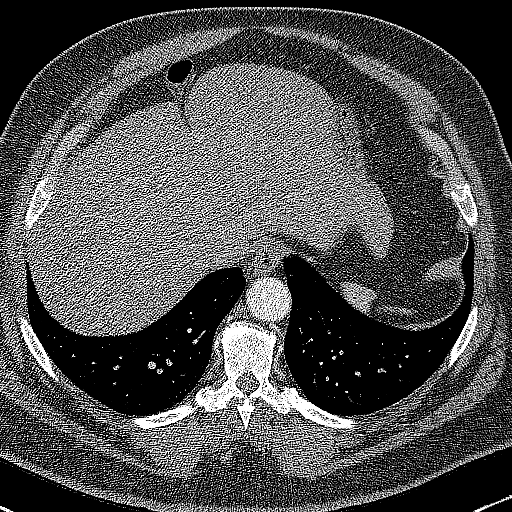
[im 71/152  mediastinal]
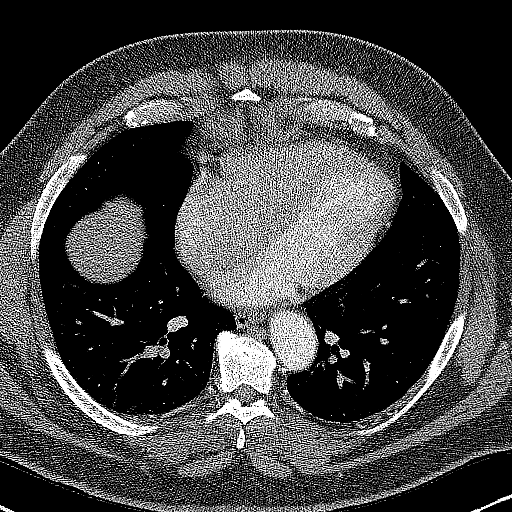
[im 81/152  mediastinal]
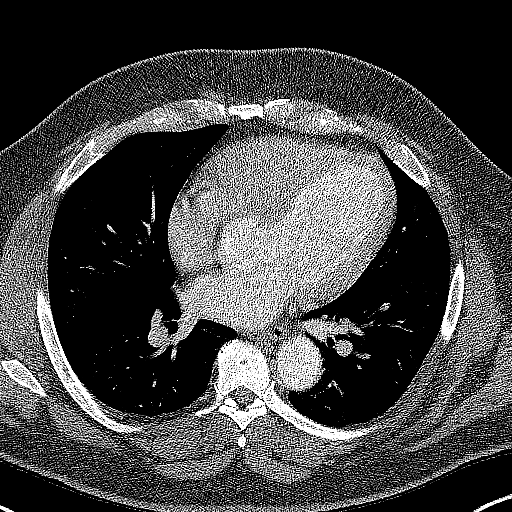
[im 101/152  mediastinal]
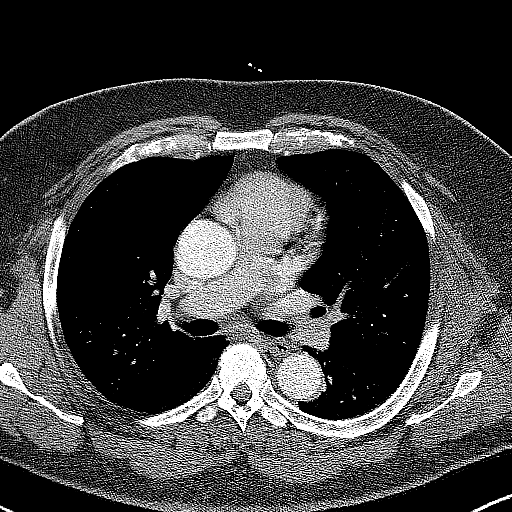
[im 121/152  mediastinal]
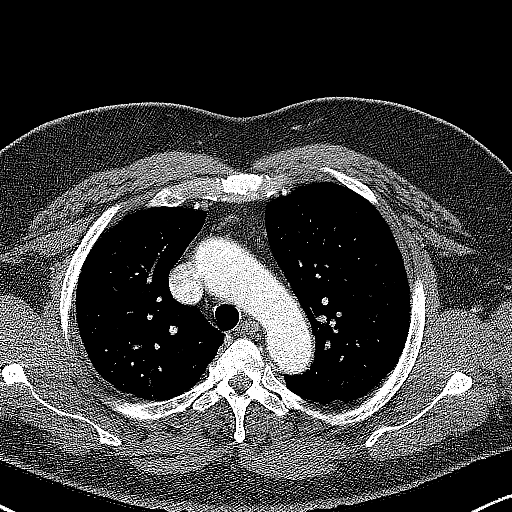

[Series 7: coronal · coronal · 0.68mm/px · 1 of 111 slices shown]
[im 56/111  mediastinal]
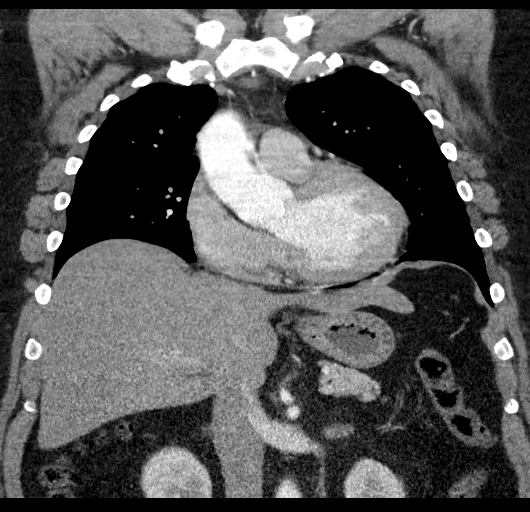

[18 of 36 positions shown; findings below may reference images not displayed]

FINDINGS: Cardiovascular: Ascending thoracic aortic diameter measures 4.3 x
4.3 cm. Measured diameter at the sinuses of Valsalva measures
cm. Measured aortic diameter at the aortic arch measures 3.3 cm.
Measured descending thoracic aortic diameter at the main pulmonary
outflow tract level measures 3.2 x 3.2 cm. There is no thoracic
aortic dissection. The visualized great vessels appear unremarkable.
Note that the right innominate and left common carotid arteries
arise as a common trunk, an anatomic variant. No pericardial
effusion or pericardial thickening.

Measured diameter at the main pulmonary outflow tract measures
cm.

No evident pulmonary embolus.

Mediastinum/Nodes: Visualized thyroid appears unremarkable. There is
no evident thoracic adenopathy. No esophageal lesions are
appreciable.

Lungs/Pleura: There is no evident edema or consolidation. No evident
pleural effusion.

Upper Abdomen: Visualized upper abdominal structures appear
unremarkable.

Musculoskeletal: There are foci of degenerative change in the
thoracic spine. No blastic or lytic bone lesions. No chest wall
lesions evident.

Review of the MIP images confirms the above findings.
IMPRESSION: 1. Ascending thoracic aortic diameter measures 4.3 x 4.3 cm. No
dissection. Recommend annual imaging followup by CTA or MRA. This
recommendation follows [L7]
ACCF/AHA/AATS/ACR/ASA/SCA/TIGER/TIGER/TIGER/TIGER Guidelines for the
Diagnosis and Management of Patients with Thoracic Aortic Disease.
Circulation. [L7]; 121: [id number]. Aortic aneurysm NOS
([L7]-[L7]).

2. Prominence of the main pulmonary outflow tract, a finding
indicative of a degree of pulmonary arterial hypertension.

3.  Lungs clear.

4.  No evident thoracic adenopathy.

## 2018-11-19 MED ORDER — IOHEXOL 350 MG/ML SOLN
75.0000 mL | Freq: Once | INTRAVENOUS | Status: AC | PRN
  Administered 2018-11-19: 75 mL via INTRAVENOUS

## 2018-11-20 ENCOUNTER — Telehealth: Payer: Self-pay

## 2018-11-20 DIAGNOSIS — I7121 Aneurysm of the ascending aorta, without rupture: Secondary | ICD-10-CM

## 2018-11-20 DIAGNOSIS — I712 Thoracic aortic aneurysm, without rupture: Secondary | ICD-10-CM

## 2018-11-20 DIAGNOSIS — I77810 Thoracic aortic ectasia: Secondary | ICD-10-CM

## 2018-11-20 NOTE — Telephone Encounter (Signed)
Patient made aware of CTA results with verbal understanding. Patient will send his 7 day BP results through mychart. Order for 1 yr repeat CTA in [place].

## 2018-11-20 NOTE — Telephone Encounter (Signed)
-----   Message from Wellington Hampshire, MD sent at 11/19/2018  2:36 PM EDT ----- Inform patient that CTA showed mild enlargement of aortic aneurysm. Repeat CT in 1 year.  We need to make sure his BP is well controlled. He should record his BP daily over the next week and send [place] the results .

## 2018-12-06 ENCOUNTER — Encounter: Payer: BC Managed Care – PPO | Admitting: Primary Care

## 2018-12-14 ENCOUNTER — Ambulatory Visit (INDEPENDENT_AMBULATORY_CARE_PROVIDER_SITE_OTHER): Payer: BC Managed Care – PPO | Admitting: Primary Care

## 2018-12-14 ENCOUNTER — Other Ambulatory Visit: Payer: Self-pay

## 2018-12-14 ENCOUNTER — Encounter: Payer: Self-pay | Admitting: Primary Care

## 2018-12-14 VITALS — BP 126/86 | HR 82 | Temp 98.0°F | Ht 66.0 in | Wt 288.2 lb

## 2018-12-14 DIAGNOSIS — Z0000 Encounter for general adult medical examination without abnormal findings: Secondary | ICD-10-CM | POA: Diagnosis not present

## 2018-12-14 DIAGNOSIS — R7303 Prediabetes: Secondary | ICD-10-CM

## 2018-12-14 DIAGNOSIS — I10 Essential (primary) hypertension: Secondary | ICD-10-CM | POA: Diagnosis not present

## 2018-12-14 DIAGNOSIS — E785 Hyperlipidemia, unspecified: Secondary | ICD-10-CM

## 2018-12-14 DIAGNOSIS — Z23 Encounter for immunization: Secondary | ICD-10-CM | POA: Diagnosis not present

## 2018-12-14 DIAGNOSIS — Z1211 Encounter for screening for malignant neoplasm of colon: Secondary | ICD-10-CM

## 2018-12-14 DIAGNOSIS — I77810 Thoracic aortic ectasia: Secondary | ICD-10-CM

## 2018-12-14 DIAGNOSIS — D649 Anemia, unspecified: Secondary | ICD-10-CM

## 2018-12-14 NOTE — Assessment & Plan Note (Signed)
Fasting gluocse reading of 92 on recent labs. He is working on weight loss through diet and exercise, commended him on this.  Repeat A1C in early 2021.

## 2018-12-14 NOTE — Addendum Note (Signed)
Addended by: Jacqualin Combes on: 12/14/2018 11:37 AM   Modules accepted: Orders

## 2018-12-14 NOTE — Assessment & Plan Note (Signed)
Slight anemia on labs from September 2020, denies rectal bleeding or other blood loss. Renal function is WNL.  Repeat CBC in early January 2021 with other labs.

## 2018-12-14 NOTE — Patient Instructions (Addendum)
You will be contacted regarding your referral to GI for the colonoscopy.  Please let us know if you have not been contacted within one week.   Start exercising. You should be getting 150 minutes of moderate intensity exercise weekly.  Continue to work on a healthy diet. Ensure you are consuming 64 ounces of water daily.  Schedule a lab only visit for early January 2021.  It was a pleasure to see you today!   Preventive Care 22-51 Years Old, Male Preventive care refers to lifestyle choices and visits with your health care provider that can promote health and wellness. This includes:  A yearly physical exam. This is also called an annual well check.  Regular dental and eye exams.  Immunizations.  Screening for certain conditions.  Healthy lifestyle choices, such as eating a healthy diet, getting regular exercise, not using drugs or products that contain nicotine and tobacco, and limiting alcohol use. What can I expect for my preventive care visit? Physical exam Your health care provider will check:  Height and weight. These may be used to calculate body mass index (BMI), which is a measurement that tells if you are at a healthy weight.  Heart rate and blood pressure.  Your skin for abnormal spots. Counseling Your health care provider may ask you questions about:  Alcohol, tobacco, and drug use.  Emotional well-being.  Home and relationship well-being.  Sexual activity.  Eating habits.  Work and work Statistician. What immunizations do I need?  Influenza (flu) vaccine  This is recommended every year. Tetanus, diphtheria, and pertussis (Tdap) vaccine  You may need a Td booster every 10 years. Varicella (chickenpox) vaccine  You may need this vaccine if you have not already been vaccinated. Zoster (shingles) vaccine  You may need this after age 70. Measles, mumps, and rubella (MMR) vaccine  You may need at least one dose of MMR if you were born in 1957 or  later. You may also need a second dose. Pneumococcal conjugate (PCV13) vaccine  You may need this if you have certain conditions and were not previously vaccinated. Pneumococcal polysaccharide (PPSV23) vaccine  You may need one or two doses if you smoke cigarettes or if you have certain conditions. Meningococcal conjugate (MenACWY) vaccine  You may need this if you have certain conditions. Hepatitis A vaccine  You may need this if you have certain conditions or if you travel or work in places where you may be exposed to hepatitis A. Hepatitis B vaccine  You may need this if you have certain conditions or if you travel or work in places where you may be exposed to hepatitis B. Haemophilus influenzae type b (Hib) vaccine  You may need this if you have certain risk factors. Human papillomavirus (HPV) vaccine  If recommended by your health care provider, you may need three doses over 6 months. You may receive vaccines as individual doses or as more than one vaccine together in one shot (combination vaccines). Talk with your health care provider about the risks and benefits of combination vaccines. What tests do I need? Blood tests  Lipid and cholesterol levels. These may be checked every 5 years, or more frequently if you are over 30 years old.  Hepatitis C test.  Hepatitis B test. Screening  Lung cancer screening. You may have this screening every year starting at age 57 if you have a 30-pack-year history of smoking and currently smoke or have quit within the past 15 years.  Prostate cancer screening. Recommendations  will vary depending on your family history and other risks.  Colorectal cancer screening. All adults should have this screening starting at age 70 and continuing until age 59. Your health care provider may recommend screening at age 6 if you are at increased risk. You will have tests every 1-10 years, depending on your results and the type of screening  test.  Diabetes screening. This is done by checking your blood sugar (glucose) after you have not eaten for a while (fasting). You may have this done every 1-3 years.  Sexually transmitted disease (STD) testing. Follow these instructions at home: Eating and drinking  Eat a diet that includes fresh fruits and vegetables, whole grains, lean protein, and low-fat dairy products.  Take vitamin and mineral supplements as recommended by your health care provider.  Do not drink alcohol if your health care provider tells you not to drink.  If you drink alcohol: ? Limit how much you have to 0-2 drinks a day. ? Be aware of how much alcohol is in your drink. In the [place] one drink equals one 12 oz bottle of beer (355 mL), one 5 oz glass of wine (148 mL), or one 1 oz glass of hard liquor (44 mL). Lifestyle  Take daily care of your teeth and gums.  Stay active. Exercise for at least 30 minutes on 5 or more days each week.  Do not use any products that contain nicotine or tobacco, such as cigarettes, e-cigarettes, and chewing tobacco. If you need help quitting, ask your health care provider.  If you are sexually active, practice safe sex. Use a condom or other form of protection to prevent STIs (sexually transmitted infections).  Talk with your health care provider about taking a low-dose aspirin every day starting at age 33. What's next?  Go to your health care provider once a year for a well check visit.  Ask your health care provider how often you should have your eyes and teeth checked.  Stay up to date on all vaccines. This information is not intended to replace advice given to you by your health care provider. Make sure you discuss any questions you have with your health care provider. Document Released: 03/06/2015 Document Revised: 02/01/2018 Document Reviewed: 02/01/2018 Elsevier Patient Education  2020 [employer].

## 2018-12-14 NOTE — Progress Notes (Signed)
Subjective:    Patient ID: Tracy Lin, male    DOB: 28-Mar-1965, 53 y.o.   MRN: [id number]  HPI  Mr. Feffer is a 53 year old male who presents today for complete physical.  Immunizations: -Tetanus: Completed in 2017 -Influenza: Due today -Shingles: Never completed, declines today  Diet: He has changed his diet since mid September 2020. He is eating grilled protein, decreased fried food, increasing vegetables, brown rice. Little take take food. Infrequent desserts. Drinking mostly coffee, water, occasional soda.  Exercise: He is walking some.  Eye exam: No recent exam Dental exam: Completes semi-annually  Colonoscopy: Never completed, referral placed in 2019. Due now. PSA: 0.59 in 2020  BP Readings from Last 3 Encounters:  12/14/18 126/86  11/01/18 (!) 140/100  11/17/17 140/86   Wt Readings from Last 3 Encounters:  12/14/18 288 lb 4 oz (130.7 kg)  11/01/18 293 lb 12 oz (133.2 kg)  11/17/17 288 lb (130.6 kg)     Review of Systems  Constitutional: Negative for unexpected weight change.  HENT: Negative for rhinorrhea.   Respiratory: Negative for cough and shortness of breath.   Cardiovascular: Negative for chest pain.  Gastrointestinal: Negative for constipation and diarrhea.  Genitourinary: Negative for difficulty urinating.  Musculoskeletal: Positive for arthralgias.       Chronic right knee pain  Skin: Negative for rash.  Allergic/Immunologic: Negative for environmental allergies.  Neurological: Negative for dizziness, numbness and headaches.  Psychiatric/Behavioral: The patient is not nervous/anxious.        Past Medical History:  Diagnosis Date  . Hyperlipidemia   . Hypertension   . Obesity      Social History   Socioeconomic History  . Marital status: Married    Spouse name: Not on file  . Number of children: Not on file  . Years of education: Not on file  . Highest education level: Not on file  Occupational History  . Occupation: IT  Social  Needs  . Financial resource strain: Not on file  . Food insecurity    Worry: Not on file    Inability: Not on file  . Transportation needs    Medical: Not on file    Non-medical: Not on file  Tobacco Use  . Smoking status: Never Smoker  . Smokeless tobacco: Never Used  Substance and Sexual Activity  . Alcohol use: Yes    Alcohol/week: 0.0 standard drinks    Comment: socially -  1-2 approx 3 x week  . Drug use: No  . Sexual activity: Not on file  Lifestyle  . Physical activity    Days per week: Not on file    Minutes per session: Not on file  . Stress: Not on file  Relationships  . Social Herbalist on phone: Not on file    Gets together: Not on file    Attends religious service: Not on file    Active member of club or organization: Not on file    Attends meetings of clubs or organizations: Not on file    Relationship status: Not on file  . Intimate partner violence    Fear of current or ex partner: Not on file    Emotionally abused: Not on file    Physically abused: Not on file    Forced sexual activity: Not on file  Other Topics Concern  . Not on file  Social History Narrative   Married.   2 children.   Works for [employer].  Enjoys camping, fishing, being outdoors.    Past Surgical History:  Procedure Laterality Date  . KNEE SURGERY Right   . TRIGGER FINGER RELEASE Right     Family History  Problem Relation Age of Onset  . Hypertension Mother   . Colon cancer Mother   . Hypertension Father   . Prostate cancer Father   . Diabetes Father     No Known Allergies  Current Outpatient Medications on File Prior to Visit  Medication Sig Dispense Refill  . lisinopril-hydrochlorothiazide (ZESTORETIC) 20-25 MG tablet TAKE 1 TABLET BY MOUTH DAILY. 90 tablet 3   No current facility-administered medications on file prior to visit.     BP 126/86   Pulse 82   Temp 98 F (36.7 C) (Temporal)   Ht 5\' 6"  (1.676 m)   Wt 288 lb 4 oz (130.7 kg)   SpO2 98%    BMI 46.52 kg/m    Objective:   Physical Exam  Constitutional: He is oriented to person, place, and time. He appears well-nourished.  HENT:  Right Ear: Tympanic membrane and ear canal normal.  Left Ear: Tympanic membrane and ear canal normal.  Mouth/Throat: Oropharynx is clear and moist.  Eyes: Pupils are equal, round, and reactive to light. EOM are normal.  Neck: Neck supple.  Cardiovascular: Normal rate and regular rhythm.  Respiratory: Effort normal and breath sounds normal.  GI: Soft. Bowel sounds are normal. There is no abdominal tenderness.  Musculoskeletal: Normal range of motion.  Neurological: He is alert and oriented to person, place, and time.  Skin: Skin is warm and dry.  Psychiatric: He has a normal mood and affect.           Assessment & Plan:

## 2018-12-14 NOTE — Assessment & Plan Note (Addendum)
Has not started statin therapy per cardiology recommendations as he didn't want to take another medication.  LDL of 135. Given aortic aneurysm and history of HTN we discussed the importance of keeping LDL below 70. He is working on lifestyle changes for weight loss and would like another several months.  Repeat lipids in early January 2021.

## 2018-12-14 NOTE — Assessment & Plan Note (Signed)
Tetanus UTD, influenza vaccination provided. Declines Shingrix. PSA UTD. Colonoscopy due, ordered.  Commended him on dietary changes, encouraged regular exercise.  Exam today unremarkable.  Labs from September 2020 reviewed, will repeat in January 2021.

## 2018-12-14 NOTE — Assessment & Plan Note (Signed)
CT angio reviewed, aneurysm measuring 4.3 cm.  Following with cardiology.

## 2018-12-14 NOTE — Assessment & Plan Note (Signed)
Stable in the office today, BMP from September 2020 reviewed. Continue current regimen  Encouraged continued weight loss through diet and exercise.

## 2018-12-25 ENCOUNTER — Encounter: Payer: Self-pay | Admitting: *Deleted

## 2019-03-11 ENCOUNTER — Other Ambulatory Visit: Payer: Self-pay | Admitting: Primary Care

## 2019-03-11 DIAGNOSIS — E785 Hyperlipidemia, unspecified: Secondary | ICD-10-CM

## 2019-03-11 DIAGNOSIS — D649 Anemia, unspecified: Secondary | ICD-10-CM

## 2019-03-11 DIAGNOSIS — R7303 Prediabetes: Secondary | ICD-10-CM

## 2019-03-14 ENCOUNTER — Telehealth: Payer: Self-pay

## 2019-03-14 NOTE — Telephone Encounter (Signed)
LVM w COVID screen, front door and back lab info 1.21.2021 TLJ

## 2019-03-18 ENCOUNTER — Other Ambulatory Visit: Payer: Self-pay

## 2019-03-18 ENCOUNTER — Other Ambulatory Visit (INDEPENDENT_AMBULATORY_CARE_PROVIDER_SITE_OTHER): Payer: BC Managed Care – PPO

## 2019-03-18 DIAGNOSIS — E785 Hyperlipidemia, unspecified: Secondary | ICD-10-CM

## 2019-03-18 DIAGNOSIS — R7303 Prediabetes: Secondary | ICD-10-CM

## 2019-03-18 DIAGNOSIS — D649 Anemia, unspecified: Secondary | ICD-10-CM

## 2019-03-18 LAB — CBC
HCT: 40.2 % (ref 39.0–52.0)
Hemoglobin: 13.2 g/dL (ref 13.0–17.0)
MCHC: 32.9 g/dL (ref 30.0–36.0)
MCV: 89.3 fl (ref 78.0–100.0)
Platelets: 211 10*3/uL (ref 150.0–400.0)
RBC: 4.51 Mil/uL (ref 4.22–5.81)
RDW: 12.9 % (ref 11.5–15.5)
WBC: 4.8 10*3/uL (ref 4.0–10.5)

## 2019-03-18 LAB — LIPID PANEL
Cholesterol: 205 mg/dL — ABNORMAL HIGH (ref 0–200)
HDL: 55.2 mg/dL (ref >39.00)
LDL Cholesterol: 135 mg/dL — ABNORMAL HIGH (ref 0–99)
NonHDL: 149.9
Total CHOL/HDL Ratio: 4
Triglycerides: 73 mg/dL (ref 0.0–149.0)
VLDL: 14.6 mg/dL (ref 0.0–40.0)

## 2019-03-18 LAB — HEMOGLOBIN A1C: Hgb A1c MFr Bld: 5.7 % (ref 4.6–6.5)

## 2019-05-03 ENCOUNTER — Telehealth: Payer: Self-pay

## 2019-05-03 ENCOUNTER — Other Ambulatory Visit: Payer: Self-pay

## 2019-05-03 DIAGNOSIS — Z1211 Encounter for screening for malignant neoplasm of colon: Secondary | ICD-10-CM

## 2019-05-03 DIAGNOSIS — Z800 Family history of malignant neoplasm of digestive organs: Secondary | ICD-10-CM

## 2019-05-03 NOTE — Telephone Encounter (Signed)
Gastroenterology Pre-Procedure Review  Request Date: Thursday 05/23/19 Requesting Physician: Dr. Vicente Males  PATIENT REVIEW QUESTIONS: The patient responded to the following health history questions as indicated:    1. Are you having any GI issues? no 2. Do you have a personal history of Polyps? no 3. Do you have a family history of Colon Cancer or Polyps? yes (mother colon cancer) 4. Diabetes Mellitus? no 5. Joint replacements in the past 12 months?no 6. Major health problems in the past 3 months?no 7. Any artificial heart valves, MVP, or defibrillator?no    MEDICATIONS & ALLERGIES:    Patient reports the following regarding taking any anticoagulation/antiplatelet therapy:   Plavix, Coumadin, Eliquis, Xarelto, Lovenox, Pradaxa, Brilinta, or Effient? no Aspirin? no  Patient confirms/reports the following medications:  Current Outpatient Medications  Medication Sig Dispense Refill  . lisinopril-hydrochlorothiazide (ZESTORETIC) 20-25 MG tablet TAKE 1 TABLET BY MOUTH DAILY. 90 tablet 3   No current facility-administered medications for this visit.    Patient confirms/reports the following allergies:  No Known Allergies  No orders of the defined types were placed in this encounter.   AUTHORIZATION INFORMATION Primary Insurance: 1D#: Group #:  Secondary Insurance: 1D#: Group #:  SCHEDULE INFORMATION: Date: Thursday 05/23/19 Time: Location:[facility]

## 2019-05-20 ENCOUNTER — Other Ambulatory Visit: Payer: Self-pay | Admitting: Gastroenterology

## 2019-05-21 ENCOUNTER — Other Ambulatory Visit
Admission: RE | Admit: 2019-05-21 | Discharge: 2019-05-21 | Disposition: A | Discharge status: Home / Self Care | Payer: BC Managed Care – PPO | Source: Ambulatory Visit | Attending: Gastroenterology | Admitting: Gastroenterology

## 2019-05-21 ENCOUNTER — Other Ambulatory Visit: Payer: Self-pay

## 2019-05-21 DIAGNOSIS — Z20822 Contact with and (suspected) exposure to covid-19: Secondary | ICD-10-CM | POA: Diagnosis not present

## 2019-05-21 DIAGNOSIS — Z01812 Encounter for preprocedural laboratory examination: Secondary | ICD-10-CM | POA: Insufficient documentation

## 2019-05-21 LAB — SARS CORONAVIRUS 2 (TAT 6-24 HRS): SARS Coronavirus 2: NEGATIVE

## 2019-05-23 ENCOUNTER — Ambulatory Visit: Payer: BC Managed Care – PPO | Admitting: Anesthesiology

## 2019-05-23 ENCOUNTER — Encounter: Payer: Self-pay | Admitting: Gastroenterology

## 2019-05-23 ENCOUNTER — Ambulatory Visit
Admission: RE | Admit: 2019-05-23 | Discharge: 2019-05-23 | Disposition: A | Discharge status: Home / Self Care | Payer: BC Managed Care – PPO | Source: Ambulatory Visit | Attending: Gastroenterology | Admitting: Gastroenterology

## 2019-05-23 ENCOUNTER — Other Ambulatory Visit: Payer: Self-pay

## 2019-05-23 ENCOUNTER — Encounter
Admission: RE | Disposition: A | Discharge status: Home / Self Care | Payer: Self-pay | Source: Ambulatory Visit | Attending: Gastroenterology

## 2019-05-23 DIAGNOSIS — Z1211 Encounter for screening for malignant neoplasm of colon: Secondary | ICD-10-CM | POA: Diagnosis not present

## 2019-05-23 DIAGNOSIS — K621 Rectal polyp: Secondary | ICD-10-CM | POA: Diagnosis not present

## 2019-05-23 DIAGNOSIS — I10 Essential (primary) hypertension: Secondary | ICD-10-CM | POA: Diagnosis not present

## 2019-05-23 DIAGNOSIS — Z6841 Body Mass Index (BMI) 40.0 and over, adult: Secondary | ICD-10-CM | POA: Diagnosis not present

## 2019-05-23 DIAGNOSIS — Z8249 Family history of ischemic heart disease and other diseases of the circulatory system: Secondary | ICD-10-CM | POA: Diagnosis not present

## 2019-05-23 DIAGNOSIS — E785 Hyperlipidemia, unspecified: Secondary | ICD-10-CM | POA: Insufficient documentation

## 2019-05-23 DIAGNOSIS — D128 Benign neoplasm of rectum: Secondary | ICD-10-CM | POA: Diagnosis not present

## 2019-05-23 DIAGNOSIS — D123 Benign neoplasm of transverse colon: Secondary | ICD-10-CM | POA: Insufficient documentation

## 2019-05-23 DIAGNOSIS — K635 Polyp of colon: Secondary | ICD-10-CM

## 2019-05-23 DIAGNOSIS — Z800 Family history of malignant neoplasm of digestive organs: Secondary | ICD-10-CM | POA: Diagnosis not present

## 2019-05-23 DIAGNOSIS — E669 Obesity, unspecified: Secondary | ICD-10-CM | POA: Insufficient documentation

## 2019-05-23 HISTORY — PX: COLONOSCOPY WITH PROPOFOL: SHX5780

## 2019-05-23 SURGERY — COLONOSCOPY WITH PROPOFOL
Anesthesia: General

## 2019-05-23 MED ORDER — SODIUM CHLORIDE 0.9 % IV SOLN: INTRAVENOUS | Status: DC

## 2019-05-23 MED ORDER — PROPOFOL 500 MG/50ML IV EMUL
INTRAVENOUS | Status: DC | PRN
  Administered 2019-05-23: 175 ug/kg/min via INTRAVENOUS

## 2019-05-23 MED ORDER — LIDOCAINE HCL (PF) 2 % IJ SOLN
INTRAMUSCULAR | Status: AC
  Filled 2019-05-23: qty 5

## 2019-05-23 MED ORDER — LIDOCAINE HCL (CARDIAC) PF 100 MG/5ML IV SOSY
PREFILLED_SYRINGE | INTRAVENOUS | Status: DC | PRN
  Administered 2019-05-23: 50 mg via INTRAVENOUS

## 2019-05-23 MED ORDER — PROPOFOL 10 MG/ML IV BOLUS
INTRAVENOUS | Status: DC | PRN
  Administered 2019-05-23: 70 mg via INTRAVENOUS

## 2019-05-23 MED ORDER — PROPOFOL 500 MG/50ML IV EMUL
INTRAVENOUS | Status: AC
  Filled 2019-05-23: qty 50

## 2019-05-23 NOTE — Op Note (Signed)
[facility] Gastroenterology Patient Name: Tracy Lin Procedure Date: 05/23/2019 9:20 AM MRN: [id number] Account #: [id number] Date of Birth: [birth year] Admit Type: Outpatient Age: 54 Room: [facility] ENDO ROOM 1 Gender: Male Note Status: Finalized Procedure:             Colonoscopy Indications:           Screening in patient at increased risk: Family history                         of 1st-degree relative with colorectal cancer Providers:             Jonathon Bellows MD, MD Referring MD:          Pleas Koch (Referring MD) Medicines:             Monitored Anesthesia Care Complications:         No immediate complications. Procedure:             Pre-Anesthesia Assessment:                        - Prior to the procedure, a History and Physical was                         performed, and patient medications, allergies and                         sensitivities were reviewed. The patient's tolerance                         of previous anesthesia was reviewed.                        - The risks and benefits of the procedure and the                         sedation options and risks were discussed with the                         patient. All questions were answered and informed                         consent was obtained.                        - ASA Grade Assessment: II - A patient with mild                         systemic disease.                        After obtaining informed consent, the colonoscope was                         passed under direct vision. Throughout the procedure,                         the patient's blood pressure, pulse, and oxygen                         saturations were monitored  continuously. The                         Colonoscope was introduced through the anus and                         advanced to the the cecum, identified by the                         appendiceal orifice. The colonoscopy was performed                         with ease.  The patient tolerated the procedure well.                         The quality of the bowel preparation was excellent. Findings:      The perianal and digital rectal examinations were normal.      Two sessile polyps were found in the rectum and transverse colon. The       polyps were 4 to 6 mm in size. These polyps were removed with a cold       snare. Resection and retrieval were complete.      The exam was otherwise without abnormality on direct and retroflexion       views. Impression:            - Two 4 to 6 mm polyps in the rectum and in the                         transverse colon, removed with a cold snare. Resected                         and retrieved.                        - The examination was otherwise normal on direct and                         retroflexion views. Recommendation:        - Discharge patient to home (with escort).                        - Resume previous diet.                        - Continue present medications.                        - Await pathology results.                        - Repeat colonoscopy in 5 years for surveillance. Procedure Code(s):     --- Professional ---                        [phone], Colonoscopy, flexible; with removal of                         tumor(s), polyp(s), or other lesion(s) by snare  technique Diagnosis Code(s):     --- Professional ---                        Z80.0, Family history of malignant neoplasm of                         digestive organs                        K63.5, Polyp of colon                        K62.1, Rectal polyp CPT copyright 2019 American Medical Association. All rights reserved. The codes documented in this report are preliminary and upon coder review may  be revised to meet current compliance requirements. Jonathon Bellows, MD Jonathon Bellows MD, MD 05/23/2019 9:46:11 AM This report has been signed electronically. Number of Addenda: 0 Note Initiated On: 05/23/2019 9:20 AM Scope  Withdrawal Time: 0 hours 10 minutes 53 seconds  Total Procedure Duration: 0 hours 18 minutes 0 seconds  Estimated Blood Loss:  Estimated blood loss: none.      [facility]

## 2019-05-23 NOTE — Anesthesia Postprocedure Evaluation (Signed)
Anesthesia Post Note  Patient: Tracy Lin  Procedure(s) Performed: COLONOSCOPY WITH PROPOFOL (N/A )  Patient location during evaluation: Endoscopy Anesthesia Type: General Level of consciousness: awake and alert Pain management: pain level controlled Vital Signs Assessment: post-procedure vital signs reviewed and stable Respiratory status: spontaneous breathing, nonlabored ventilation and respiratory function stable Cardiovascular status: blood pressure returned to baseline and stable Postop Assessment: no apparent nausea or vomiting Anesthetic complications: no     Last Vitals:  Vitals:   05/23/19 1020 05/23/19 1030  BP: (!) 160/111 (!) 163/107  Pulse: 69 70  Resp: 12 14  Temp:    SpO2: 100% 100%    Last Pain:  Vitals:   05/23/19 1030  TempSrc:   PainSc: 0-No pain                 Alphonsus Sias

## 2019-05-23 NOTE — Transfer of Care (Signed)
Immediate Anesthesia Transfer of Care Note  Patient: Tracy Lin  Procedure(s) Performed: COLONOSCOPY WITH PROPOFOL (N/A )  Patient Location: PACU  Anesthesia Type:General  Level of Consciousness: drowsy  Airway & Oxygen Therapy: Patient Spontanous Breathing  Post-op Assessment: Report given to RN and Post -op Vital signs reviewed and stable  Post vital signs: Reviewed and stable  Last Vitals:  Vitals Value Taken Time  BP 141/87 05/23/19 0950  Temp 35.9 C 05/23/19 0950  Pulse 77 05/23/19 0952  Resp 19 05/23/19 0952  SpO2 97 % 05/23/19 0952  Vitals shown include unvalidated device data.  Last Pain:  Vitals:   05/23/19 0950  TempSrc: Temporal  PainSc: 0-No pain         Complications: No apparent anesthesia complications

## 2019-05-23 NOTE — Anesthesia Preprocedure Evaluation (Signed)
Anesthesia Evaluation  Patient identified by MRN, date of birth, ID band Patient awake    Reviewed: Allergy & Precautions, H&P , NPO status , reviewed documented beta blocker date and time   Airway Mallampati: II  TM Distance: >3 FB Neck ROM: full    Dental  (+) Teeth Intact   Pulmonary    Pulmonary exam normal        Cardiovascular hypertension, Normal cardiovascular exam  2016 ECHO Study Conclusions   - Left ventricle: The cavity size was normal. There was mild  concentric hypertrophy. Systolic function was vigorous. The  estimated ejection fraction was in the range of 65% to 70%. Wall  motion was normal; there were no regional wall motion  abnormalities. Doppler parameters are consistent with abnormal  left ventricular relaxation (grade 1 diastolic dysfunction).  - Aorta: Aortic root dimension: 40 mm (ED).  - Aortic root: The aortic root was mildly dilated.      Neuro/Psych    GI/Hepatic neg GERD  ,  Endo/Other    Renal/GU      Musculoskeletal   Abdominal   Peds  Hematology  (+) Blood dyscrasia, anemia ,   Anesthesia Other Findings Past Medical History: No date: Hyperlipidemia No date: Hypertension No date: Obesity  Past Surgical History: No date: KNEE SURGERY; Right No date: TRIGGER FINGER RELEASE; Right  BMI    Body Mass Index: 45.52 kg/m      Reproductive/Obstetrics                             Anesthesia Physical Anesthesia Plan  ASA: III  Anesthesia Plan: General   Post-op Pain Management:    Induction: Intravenous  PONV Risk Score and Plan: Treatment may vary due to age or medical condition and TIVA  Airway Management Planned: Nasal Cannula and Natural Airway  Additional Equipment:   Intra-op Plan:   Post-operative Plan:   Informed Consent: I have reviewed the patients History and Physical, chart, labs and discussed the procedure including  the risks, benefits and alternatives for the proposed anesthesia with the patient or authorized representative who has indicated his/her understanding and acceptance.     Dental Advisory Given  Plan Discussed with: CRNA  Anesthesia Plan Comments:         Anesthesia Quick Evaluation

## 2019-05-23 NOTE — H&P (Signed)
Jonathon Bellows, MD [address], [place], [place], [place], [id number], [place], [place], [place], [address] Phone: [phone]  Fax: [phone]  Primary Care Physician:  Pleas Koch, NP   Pre-Procedure History & Physical: HPI:  Tracy Lin is a 54 y.o. male is here for an colonoscopy.   Past Medical History:  Diagnosis Date  . Hyperlipidemia   . Hypertension   . Obesity     Past Surgical History:  Procedure Laterality Date  . KNEE SURGERY Right   . TRIGGER FINGER RELEASE Right     Prior to Admission medications   Medication Sig Start Date End Date Taking? Authorizing Provider  lisinopril-hydrochlorothiazide (ZESTORETIC) 20-25 MG tablet TAKE 1 TABLET BY MOUTH DAILY. 11/13/18  Yes Wellington Hampshire, MD    Allergies as of 05/03/2019  . (No Known Allergies)    Family History  Problem Relation Age of Onset  . Hypertension Mother   . Colon cancer Mother   . Hypertension Father   . Prostate cancer Father   . Diabetes Father     Social History   Socioeconomic History  . Marital status: Married    Spouse name: Not on file  . Number of children: Not on file  . Years of education: Not on file  . Highest education level: Not on file  Occupational History  . Occupation: IT  Tobacco Use  . Smoking status: Never Smoker  . Smokeless tobacco: Never Used  Substance and Sexual Activity  . Alcohol use: Yes    Alcohol/week: 0.0 standard drinks    Comment: socially -  1-2 approx 3 x week  . Drug use: No  . Sexual activity: Not on file  Other Topics Concern  . Not on file  Social History Narrative   Married.   2 children.   Works for [employer].   Enjoys camping, fishing, being outdoors.   Social Determinants of Health   Financial Resource Strain:   . Difficulty of Paying Living Expenses:   Food Insecurity:   . Worried About Charity fundraiser in the Last Year:   . Arboriculturist in the Last Year:   Transportation Needs:   . Lexicographer (Medical):   Marland Kitchen Lack of Transportation (Non-Medical):   Physical Activity:   . Days of Exercise per Week:   . Minutes of Exercise per Session:   Stress:   . Feeling of Stress :   Social Connections:   . Frequency of Communication with Friends and Family:   . Frequency of Social Gatherings with Friends and Family:   . Attends Religious Services:   . Active Member of Clubs or Organizations:   . Attends Archivist Meetings:   Marland Kitchen Marital Status:   Intimate Partner Violence:   . Fear of Current or Ex-Partner:   . Emotionally Abused:   Marland Kitchen Physically Abused:   . Sexually Abused:     Review of Systems: See HPI, otherwise negative ROS  Physical Exam: BP (!) 174/124   Pulse 90   Temp 97.9 F (36.6 C) (Temporal)   Resp 16   Ht 5\' 6"  (1.676 m)   Wt 127.9 kg   SpO2 100%   BMI 45.52 kg/m  General:   Alert,  pleasant and cooperative in NAD Head:  Normocephalic and atraumatic. Neck:  Supple; no masses or thyromegaly. Lungs:  Clear throughout to auscultation, normal respiratory effort.    Heart:  +S1, +S2,  Regular rate and rhythm, No edema. Abdomen:  Soft, nontender and nondistended. Normal bowel sounds, without guarding, and without rebound.   Neurologic:  Alert and  oriented x4;  grossly normal neurologically.  Impression/Plan: Tracy Lin is here for an colonoscopy to be performed for Screening colonoscopy family history of colon cancer Risks, benefits, limitations, and alternatives regarding  colonoscopy have been reviewed with the patient.  Questions have been answered.  All parties agreeable.   Jonathon Bellows, MD  05/23/2019, 9:10 AM

## 2019-05-23 NOTE — Anesthesia Procedure Notes (Signed)
Date/Time: 05/23/2019 9:23 AM Performed by: Johnna Acosta, CRNA Pre-anesthesia Checklist: Patient identified, Emergency Drugs available, Suction available, Patient being monitored and Timeout performed Patient Re-evaluated:Patient Re-evaluated prior to induction Oxygen Delivery Method: Nasal cannula Preoxygenation: Pre-oxygenation with 100% oxygen Induction Type: IV induction

## 2019-05-24 LAB — SURGICAL PATHOLOGY

## 2019-05-27 ENCOUNTER — Encounter: Payer: Self-pay | Admitting: Gastroenterology

## 2019-11-06 ENCOUNTER — Telehealth: Payer: Self-pay

## 2019-11-06 NOTE — Telephone Encounter (Signed)
Patient is calling because he received a bill for his colonoscopy. Patient states that he was told by his insurance company that the first colonoscopy that anyone has is always a screening colonoscopy. Explained to patient that if they go in there to do colonoscopy and see polyps they will remove the polyps. I informed patient he signs this consent before he has the procedure that says it is okay to remove polyps. Patient states he still does not understand why it is billed for a diagnostic colonoscopy and not a screening colonoscopy. Informed patient he needed to call his insurance and see if this got applied to his dutiable. Informed patient there is a hospital fee, our fee and then a  Anesthesia  fee. Patient states he will call his insurance and the billing office

## 2019-11-06 NOTE — Telephone Encounter (Signed)
[employer] called and states that his plan will cover the procedure If we change the code to just to a screening colonoscopy. Explained to [place] that that is fraud because we took out polyps and coded that polyps were removed. [employer] stated that is not fraud. Informed [employer] that I would give to my Scientist, research (medical) and she would research the information and give [employer] a call back.  The direct number is [phone].  Please call [employer] back in what you find out.

## 2019-11-12 ENCOUNTER — Encounter: Payer: Self-pay | Admitting: Cardiovascular Disease

## 2019-11-12 ENCOUNTER — Other Ambulatory Visit: Payer: Self-pay

## 2019-11-12 ENCOUNTER — Ambulatory Visit (INDEPENDENT_AMBULATORY_CARE_PROVIDER_SITE_OTHER): Payer: BC Managed Care – PPO | Admitting: Cardiovascular Disease

## 2019-11-12 VITALS — BP 128/100 | HR 91 | Ht 66.0 in | Wt 270.4 lb

## 2019-11-12 DIAGNOSIS — I77810 Thoracic aortic ectasia: Secondary | ICD-10-CM | POA: Diagnosis not present

## 2019-11-12 DIAGNOSIS — I10 Essential (primary) hypertension: Secondary | ICD-10-CM | POA: Diagnosis not present

## 2019-11-12 DIAGNOSIS — E785 Hyperlipidemia, unspecified: Secondary | ICD-10-CM | POA: Diagnosis not present

## 2019-11-12 NOTE — Patient Instructions (Signed)
Medication Instructions:  Your physician recommends that you continue on your current medications as directed. Please refer to the Current Medication list given to you today.  *If you need a refill on your cardiac medications before your next appointment, please call your pharmacy*   Lab Work: bmet today If you have labs (blood work) drawn today and your tests are completely normal, you will receive your results only by: Marland Kitchen MyChart Message (if you have MyChart) OR . A paper copy in the mail If you have any lab test that is abnormal or we need to change your treatment, we will call you to review the results.   Testing/Procedures: Non-Cardiac CT Angiography (CTA), is a special type of CT scan that uses a computer to produce multi-dimensional views of major blood vessels throughout the body. In CT angiography, a contrast material is injected through an IV to help visualize the blood vessels (Please call [phone] to schedule the CT of your aorta)   Follow-Up: At [facility], you and your health needs are our priority.  As part of our continuing mission to provide you with exceptional heart care, we have created designated Provider Care Teams.  These Care Teams include your primary Cardiologist (physician) and Advanced Practice Providers (APPs -  Physician Assistants and Nurse Practitioners) who all work together to provide you with the care you need, when you need it.  We recommend signing up for the patient portal called "MyChart".  Sign up information is provided on this After Visit Summary.  MyChart is used to connect with patients for Virtual Visits (Telemedicine).  Patients are able to view lab/test results, encounter notes, upcoming appointments, etc.  Non-urgent messages can be sent to your provider as well.   To learn more about what you can do with MyChart, go to [email or web address].    Your next appointment:   6 month(s)  The format for your next appointment:   In  Person  Provider:   You may see Dr. Fletcher Anon or one of the following Advanced Practice Providers on your designated Care Team:    Murray Hodgkins, NP  Christell Faith, PA-C  Marrianne Mood, PA-C  Cadence Kathlen Mody, [place]    Other Instructions N/A

## 2019-11-12 NOTE — Progress Notes (Signed)
Cardiology Office Note   Date:  11/12/2019   ID:  Tracy Lin, DOB [birth year], MRN [id number]  PCP:  Pleas Koch, NP  Cardiologist:   Kathlyn Sacramento, MD   Chief Complaint  Patient presents with   OTHER    12 month f/u no complaints today. Meds reviewed verbally with pt.      History of Present Illness: Tracy Lin is a 54 y.o. male who presents for a followup visit regarding hypertension and mildly dilated aortic root. He   He had an echocardiogram done in 2011 which showed normal LV systolic function, mild left ventricular hypertrophy, mildly dilated aortic root (4.1 cm) and mild mitral regurgitation. He does not smoke and drinks occasional alcohol.  Most recent echocardiogram in February 2016 which showed normal LV systolic function, grade 1 diastolic dysfunction and mildly dilated aortic root at 40 mm.  Previous sleep study showed severe sleep apnea.  However, the patient had improvement in symptoms with exercise and weight loss.  He also could not afford the CPAP.  CTA of the aorta done in September of last year showed a sending thoracic aortic aneurysm measuring 4.3 x 4.3 cm.  Been doing well with no recent chest pain, shortness of breath or palpitations.  He continues to lose weight with healthy lifestyle changes.  Past Medical History:  Diagnosis Date   Hyperlipidemia    Hypertension    Obesity     Past Surgical History:  Procedure Laterality Date   COLONOSCOPY WITH PROPOFOL N/A 05/23/2019   Procedure: COLONOSCOPY WITH PROPOFOL;  Surgeon: Jonathon Bellows, MD;  Location: [facility] ENDOSCOPY;  Service: Gastroenterology;  Laterality: N/A;   KNEE SURGERY Right    TRIGGER FINGER RELEASE Right      Current Outpatient Medications  Medication Sig Dispense Refill   lisinopril-hydrochlorothiazide (ZESTORETIC) 20-25 MG tablet TAKE 1 TABLET BY MOUTH DAILY. 90 tablet 3   No current facility-administered medications for this visit.    Allergies:   Patient  has no known allergies.    Social History:  The patient  reports that he has never smoked. He has never used smokeless tobacco. He reports current alcohol use. He reports that he does not use drugs.   Family History:  The patient's family history includes Colon cancer in his mother; Diabetes in his father; Hypertension in his father and mother; Prostate cancer in his father.    ROS:  Please see the history of present illness.   Otherwise, review of systems are positive for none.   All other systems are reviewed and negative.    PHYSICAL EXAM: VS:  BP (!) 128/100 (BP Location: Left Arm, Patient Position: Sitting, Cuff Size: Large)    Pulse 91    Ht 5\' 6"  (1.676 m)    Wt 270 lb 6 oz (122.6 kg)    SpO2 98%    BMI 43.64 kg/m  , BMI Body mass index is 43.64 kg/m. GEN: Well nourished, well developed, in no acute distress  HEENT: normal  Neck: no JVD, carotid bruits, or masses Cardiac: RRR; no murmurs, rubs, or gallops,no edema  Respiratory:  clear to auscultation bilaterally, normal work of breathing GI: soft, nontender, nondistended, + BS MS: no deformity or atrophy  Skin: warm and dry, no rash Neuro:  Strength and sensation are intact Psych: euthymic mood, full affect   EKG:  EKG is ordered today. The ekg ordered today demonstrates normal sinus rhythm with no significant ST or T wave changes.  Recent Labs: 03/18/2019: Hemoglobin 13.2; Platelets 211.0    Lipid Panel    Component Value Date/Time   CHOL 205 (H) 03/18/2019 0938   TRIG 73.0 03/18/2019 0938   HDL 55.20 03/18/2019 0938   CHOLHDL 4 03/18/2019 0938   VLDL 14.6 03/18/2019 0938   LDLCALC 135 (H) 03/18/2019 0938      Wt Readings from Last 3 Encounters:  11/12/19 270 lb 6 oz (122.6 kg)  05/23/19 282 lb (127.9 kg)  12/14/18 288 lb 4 oz (130.7 kg)       No flowsheet data found.    ASSESSMENT AND PLAN:  1.  Essential hypertension: Blood pressure is mildly elevated today and he has been having episodes of  elevated blood pressure at home.  I discussed with him the need to be more aggressive controlling his blood pressure especially with the presence of asending aortic aneurysm.  I suggested adding a small dose beta-blocker like carvedilol or Bystolic.  He wants to work on continued weight loss and increased exercise before adding another medication.  He has lost 20 pounds since last year.  2. Mildly dilated aortic root: I requested CTA of the aorta for follow-up.    3.  Morbid obesity: He is determined to lose weight and get his weight down to at least 150 pounds.   Disposition:   FU with me in 6 months.  Signed,  Kathlyn Sacramento, MD  11/12/2019 2:16 PM    [place]

## 2019-11-13 ENCOUNTER — Other Ambulatory Visit: Payer: Self-pay | Admitting: Cardiovascular Disease

## 2019-11-13 LAB — BASIC METABOLIC PANEL
BUN/Creatinine Ratio: 19 (ref 9–20)
BUN: 18 mg/dL (ref 6–24)
CO2: 26 mmol/L (ref 20–29)
Calcium: 9.6 mg/dL (ref 8.7–10.2)
Chloride: 100 mmol/L (ref 96–106)
Creatinine, Ser: 0.96 mg/dL (ref 0.76–1.27)
GFR calc Af Amer: 105 mL/min/{1.73_m2} (ref >59)
GFR calc non Af Amer: 91 mL/min/{1.73_m2} (ref >59)
Glucose: 95 mg/dL (ref 65–99)
Potassium: 3.9 mmol/L (ref 3.5–5.2)
Sodium: 139 mmol/L (ref 134–144)

## 2019-11-20 ENCOUNTER — Other Ambulatory Visit: Payer: Self-pay | Admitting: Cardiovascular Disease

## 2019-11-25 ENCOUNTER — Other Ambulatory Visit: Payer: Self-pay

## 2019-11-25 ENCOUNTER — Other Ambulatory Visit: Payer: Self-pay | Admitting: Cardiovascular Disease

## 2019-11-25 MED ORDER — LISINOPRIL-HYDROCHLOROTHIAZIDE 20-25 MG PO TABS: 1.0000 | ORAL_TABLET | Freq: Every day | ORAL | 1 refills | Status: DC

## 2019-12-05 ENCOUNTER — Ambulatory Visit
Admission: RE | Admit: 2019-12-05 | Discharge: 2019-12-05 | Disposition: A | Discharge status: Home / Self Care | Payer: BC Managed Care – PPO | Source: Ambulatory Visit | Attending: Cardiovascular Disease | Admitting: Cardiovascular Disease

## 2019-12-05 ENCOUNTER — Other Ambulatory Visit: Payer: Self-pay

## 2019-12-05 DIAGNOSIS — I712 Thoracic aortic aneurysm, without rupture: Secondary | ICD-10-CM

## 2019-12-05 DIAGNOSIS — I7121 Aneurysm of the ascending aorta, without rupture: Secondary | ICD-10-CM

## 2019-12-05 DIAGNOSIS — I77810 Thoracic aortic ectasia: Secondary | ICD-10-CM | POA: Insufficient documentation

## 2019-12-05 MED ORDER — IOHEXOL 350 MG/ML SOLN
75.0000 mL | Freq: Once | INTRAVENOUS | Status: AC | PRN
  Administered 2019-12-05: 75 mL via INTRAVENOUS

## 2020-05-12 ENCOUNTER — Ambulatory Visit (INDEPENDENT_AMBULATORY_CARE_PROVIDER_SITE_OTHER): Payer: BC Managed Care – PPO | Admitting: Cardiovascular Disease

## 2020-05-12 ENCOUNTER — Encounter: Payer: Self-pay | Admitting: Cardiovascular Disease

## 2020-05-12 ENCOUNTER — Other Ambulatory Visit: Payer: Self-pay

## 2020-05-12 VITALS — BP 158/100 | HR 99 | Ht 65.5 in | Wt 279.2 lb

## 2020-05-12 DIAGNOSIS — E785 Hyperlipidemia, unspecified: Secondary | ICD-10-CM | POA: Diagnosis not present

## 2020-05-12 DIAGNOSIS — I10 Essential (primary) hypertension: Secondary | ICD-10-CM | POA: Diagnosis not present

## 2020-05-12 DIAGNOSIS — I77810 Thoracic aortic ectasia: Secondary | ICD-10-CM | POA: Diagnosis not present

## 2020-05-12 NOTE — Progress Notes (Signed)
Cardiology Office Note   Date:  05/12/2020   ID:  Tracy Lin, DOB May 20, 1965, MRN [id number]  PCP:  Pleas Koch, NP  Cardiologist:   Kathlyn Sacramento, MD   Chief Complaint  Patient presents with   Other    6 month f/u no complaints today. Meds reviewed verbally with pt.      History of Present Illness: Tracy Lin is a 55 y.o. male who presents for a followup visit regarding hypertension and mildly dilated aortic root. He   He had an echocardiogram done in 2011 which showed normal LV systolic function, mild left ventricular hypertrophy, mildly dilated aortic root (4.1 cm) and mild mitral regurgitation. He does not smoke and drinks occasional alcohol.  Most recent echocardiogram in February 2016  showed normal LV systolic function, grade 1 diastolic dysfunction and mildly dilated aortic root at 40 mm.  Previous sleep study showed severe sleep apnea.  However, the patient had improvement in symptoms with exercise and weight loss.  He also could not afford CPAP.  CTA of the aorta was repeated and October 2021 which showed ectatic but not aneurysmal ascending thoracic aorta.  It was felt that the prior measurements were likely exaggerated by cardiac motion artifact. He has been doing well with no recent chest pain, shortness of breath or palpitation.  His blood pressure is usually elevated in our office.  He checks his blood pressure at home and usually his systolic blood pressures around 130 mmHg.  Past Medical History:  Diagnosis Date   Hyperlipidemia    Hypertension    Obesity     Past Surgical History:  Procedure Laterality Date   COLONOSCOPY WITH PROPOFOL N/A 05/23/2019   Procedure: COLONOSCOPY WITH PROPOFOL;  Surgeon: Jonathon Bellows, MD;  Location: [facility] ENDOSCOPY;  Service: Gastroenterology;  Laterality: N/A;   KNEE SURGERY Right    TRIGGER FINGER RELEASE Right      Current Outpatient Medications  Medication Sig Dispense Refill    lisinopril-hydrochlorothiazide (ZESTORETIC) 20-25 MG tablet Take 1 tablet by mouth daily. 90 tablet 1   No current facility-administered medications for this visit.    Allergies:   Patient has no known allergies.    Social History:  The patient  reports that he has never smoked. He has never used smokeless tobacco. He reports current alcohol use. He reports that he does not use drugs.   Family History:  The patient's family history includes Colon cancer in his mother; Diabetes in his father; Hypertension in his father and mother; Prostate cancer in his father.    ROS:  Please see the history of present illness.   Otherwise, review of systems are positive for none.   All other systems are reviewed and negative.    PHYSICAL EXAM: VS:  BP (!) 158/100 (BP Location: Left Arm, Patient Position: Sitting, Cuff Size: Large)    Pulse 99    Ht 5' 5.5" (1.664 m)    Wt 279 lb 4 oz (126.7 kg)    SpO2 98%    BMI 45.76 kg/m  , BMI Body mass index is 45.76 kg/m. GEN: Well nourished, well developed, in no acute distress  HEENT: normal  Neck: no JVD, carotid bruits, or masses Cardiac: RRR; no murmurs, rubs, or gallops,no edema  Respiratory:  clear to auscultation bilaterally, normal work of breathing GI: soft, nontender, nondistended, + BS MS: no deformity or atrophy  Skin: warm and dry, no rash Neuro:  Strength and sensation are intact Psych: euthymic  mood, full affect   EKG:  EKG is ordered today. The ekg ordered today demonstrates normal sinus rhythm with no significant ST or T wave changes.   Recent Labs: 11/12/2019: BUN 18; Creatinine, Ser 0.96; Potassium 3.9; Sodium 139    Lipid Panel    Component Value Date/Time   CHOL 205 (H) 03/18/2019 0938   TRIG 73.0 03/18/2019 0938   HDL 55.20 03/18/2019 0938   CHOLHDL 4 03/18/2019 0938   VLDL 14.6 03/18/2019 0938   LDLCALC 135 (H) 03/18/2019 0938      Wt Readings from Last 3 Encounters:  05/12/20 279 lb 4 oz (126.7 kg)  11/12/19 270 lb  6 oz (122.6 kg)  05/23/19 282 lb (127.9 kg)       No flowsheet data found.    ASSESSMENT AND PLAN:  1.  Essential hypertension: Suspect that the patient has a component of whitecoat syndrome.  I suggested adding another antihypertensive medication but he is hesitant given that he reports that blood pressures controlled at home.  I asked him to start monitoring blood pressure daily over the next week and send [place] the readings.  We could then consider adding amlodipine or a beta-blocker based on the readings.  I did explain to him that we need to keep his systolic blood pressure below 449 and his diastolic pressure below 80.  2.  Aortic root ectasia: Most recent CTA did not show aneurysm.  No need for follow-up on this.  3.  Hyperlipidemia: Lipid profile last year showed an LDL of 135.  We could consider coronary calcium score for risk stratification at some point.   Disposition:   FU with me in 12 months.  Signed,  Kathlyn Sacramento, MD  05/12/2020 4:35 PM    [place]

## 2020-05-12 NOTE — Patient Instructions (Signed)
Medication Instructions:  - Your physician recommends that you continue on your current medications as directed. Please refer to the Current Medication list given to you today.  *If you need a refill on your cardiac medications before your next appointment, please call your pharmacy*   Lab Work: - none ordered  If you have labs (blood work) drawn today and your tests are completely normal, you will receive your results only by: Marland Kitchen MyChart Message (if you have MyChart) OR . A paper copy in the mail If you have any lab test that is abnormal or we need to change your treatment, we will call you to review the results.   Testing/Procedures: - none ordered   Follow-Up: At [facility], you and your health needs are our priority.  As part of our continuing mission to provide you with exceptional heart care, we have created designated Provider Care Teams.  These Care Teams include your primary Cardiologist (physician) and Advanced Practice Providers (APPs -  Physician Assistants and Nurse Practitioners) who all work together to provide you with the care you need, when you need it.  We recommend signing up for the patient portal called "MyChart".  Sign up information is provided on this After Visit Summary.  MyChart is used to connect with patients for Virtual Visits (Telemedicine).  Patients are able to view lab/test results, encounter notes, upcoming appointments, etc.  Non-urgent messages can be sent to your provider as well.   To learn more about what you can do with MyChart, go to [email or web address].    Your next appointment:   1 year(s)  The format for your next appointment:   In Person  Provider:   You may see Kathlyn Sacramento, MD or one of the following Advanced Practice Providers on your designated Care Team:    Murray Hodgkins, NP  Christell Faith, PA-C  Marrianne Mood, PA-C  Cadence Kathlen Mody, [place]  Laurann Montana, NP    Other Instructions  - Monitor your blood  pressure readings over the next week and then call or send a MyChart message with those numbers.   How to Take Your Blood Pressure Blood pressure measures how strongly your blood is pressing against the walls of your arteries. Arteries are blood vessels that carry blood from your heart throughout your body. You can take your blood pressure at home with a machine. You may need to check your blood pressure at home:  To check if you have high blood pressure (hypertension).  To check your blood pressure over time.  To make sure your blood pressure medicine is working. Supplies needed:  Blood pressure machine, or monitor.  Dining room chair to sit in.  Table or desk.  Small notebook.  Pencil or pen. How to prepare Avoid these things for 30 minutes before checking your blood pressure:  Having drinks with caffeine in them, such as coffee or tea.  Drinking alcohol.  Eating.  Smoking.  Exercising. Do these things five minutes before checking your blood pressure:  Go to the bathroom and pee (urinate).  Sit in a dining chair. Do not sit in a soft couch or an armchair.  Be quiet. Do not talk. How to take your blood pressure Follow the instructions that came with your machine. If you have a digital blood pressure monitor, these may be the instructions: 1. Sit up straight. 2. Place your feet on the floor. Do not cross your ankles or legs. 3. Rest your left arm at the level of your heart.  You may rest it on a table, desk, or chair. 4. Pull up your shirt sleeve. 5. Wrap the blood pressure cuff around the upper part of your left arm. The cuff should be 1 inch (2.5 cm) above your elbow. It is best to wrap the cuff around bare skin. 6. Fit the cuff snugly around your arm. You should be able to place only one finger between the cuff and your arm. 7. Place the cord so that it rests in the bend of your elbow. 8. Press the power button. 9. Sit quietly while the cuff fills with air and  loses air. 10. Write down the numbers on the screen. 11. Wait 2-3 minutes and then repeat steps 1-10.   What do the numbers mean? Two numbers make up your blood pressure. The first number is called systolic pressure. The second is called diastolic pressure. An example of a blood pressure reading is "120 over 80" (or 120/80). If you are an adult and do not have a medical condition, use this guide to find out if your blood pressure is normal: Normal  First number: below 120.  Second number: below 80. Elevated  First number: 120-129.  Second number: below 80. Hypertension stage 1  First number: 130-139.  Second number: 80-89. Hypertension stage 2  First number: 140 or above.  Second number: 79 or above. Your blood pressure is above normal even if only the top or bottom number is above normal. Follow these instructions at home:  Check your blood pressure as often as your doctor tells you to.  Check your blood pressure at the same time every day.  Take your monitor to your next doctor's appointment. Your doctor will: ? Make sure you are using it correctly. ? Make sure it is working right.  Make sure you understand what your blood pressure numbers should be.  Tell your doctor if your medicine is causing side effects.  Keep all follow-up visits as told by your doctor. This is important. General tips:  You will need a blood pressure machine, or monitor. Your doctor can suggest a monitor. You can buy one at a drugstore or online. When choosing one: ? Choose one with an arm cuff. ? Choose one that wraps around your upper arm. Only one finger should fit between your arm and the cuff. ? Do not choose one that measures your blood pressure from your wrist or finger. Where to find more information American Heart Association: www.heart.org Contact a doctor if:  Your blood pressure keeps being high. Get help right away if:  Your first blood pressure number is higher than  180.  Your second blood pressure number is higher than 120. Summary  Check your blood pressure at the same time every day.  Avoid caffeine, alcohol, smoking, and exercise for 30 minutes before checking your blood pressure.  Make sure you understand what your blood pressure numbers should be. This information is not intended to replace advice given to you by your health care provider. Make sure you discuss any questions you have with your health care provider. Document Revised: 02/01/2019 Document Reviewed: 02/01/2019 Elsevier Patient Education  2021 [place].    Blood Pressure Record Sheet To take your blood pressure, you will need a blood pressure machine. You can buy a blood pressure machine (blood pressure monitor) at your clinic, drug store, or online. When choosing one, consider:  An automatic monitor that has an arm cuff.  A cuff that wraps snugly around your upper  arm. You should be able to fit only one finger between your arm and the cuff.  A device that stores blood pressure reading results.  Do not choose a monitor that measures your blood pressure from your wrist or finger. Follow your health care provider's instructions for how to take your blood pressure. To use this form:  Get one reading in the morning (a.m.) before you take any medicines.  Get one reading in the evening (p.m.) before supper.  Take at least 2 readings with each blood pressure check. This makes sure the results are correct. Wait 1-2 minutes between measurements.  Write down the results in the spaces on this form.  Repeat this once a week, or as told by your health care provider.  Make a follow-up appointment with your health care provider to discuss the results. Blood pressure log Date: _______________________  a.m. _____________________(1st reading) _____________________(2nd reading)  p.m. _____________________(1st reading) _____________________(2nd reading) Date:  _______________________  a.m. _____________________(1st reading) _____________________(2nd reading)  p.m. _____________________(1st reading) _____________________(2nd reading) Date: _______________________  a.m. _____________________(1st reading) _____________________(2nd reading)  p.m. _____________________(1st reading) _____________________(2nd reading) Date: _______________________  a.m. _____________________(1st reading) _____________________(2nd reading)  p.m. _____________________(1st reading) _____________________(2nd reading) Date: _______________________  a.m. _____________________(1st reading) _____________________(2nd reading)  p.m. _____________________(1st reading) _____________________(2nd reading) This information is not intended to replace advice given to you by your health care provider. Make sure you discuss any questions you have with your health care provider. Document Revised: 05/29/2019 Document Reviewed: 05/29/2019 Elsevier Patient Education  2021 [employer].

## 2020-07-06 ENCOUNTER — Other Ambulatory Visit: Payer: Self-pay

## 2020-07-06 ENCOUNTER — Other Ambulatory Visit: Payer: Self-pay | Admitting: Cardiovascular Disease

## 2020-07-06 MED ORDER — LISINOPRIL-HYDROCHLOROTHIAZIDE 20-25 MG PO TABS
1.0000 | ORAL_TABLET | Freq: Every day | ORAL | 1 refills | Status: DC
  Filled 2020-07-06: qty 90, 90d supply, fill #0
  Filled 2021-01-01: qty 90, 90d supply, fill #1

## 2020-09-08 DIAGNOSIS — G5603 Carpal tunnel syndrome, bilateral upper limbs: Secondary | ICD-10-CM | POA: Diagnosis not present

## 2020-09-24 ENCOUNTER — Other Ambulatory Visit (HOSPITAL_BASED_OUTPATIENT_CLINIC_OR_DEPARTMENT_OTHER): Payer: Self-pay

## 2020-09-24 ENCOUNTER — Other Ambulatory Visit: Payer: Self-pay

## 2020-09-24 ENCOUNTER — Ambulatory Visit: Payer: BC Managed Care – PPO | Attending: Internal Medicine

## 2020-09-24 DIAGNOSIS — Z23 Encounter for immunization: Secondary | ICD-10-CM

## 2020-09-24 MED ORDER — COVID-19 MRNA VACC (MODERNA) 100 MCG/0.5ML IM SUSP
INTRAMUSCULAR | 0 refills | Status: DC
  Filled 2020-09-24: qty 0.25, 1d supply, fill #0

## 2020-09-24 NOTE — Progress Notes (Signed)
   Covid-19 Vaccination Clinic  Name:  JAGEN OSBORNE    MRN: [id number] DOB: 09-Jul-1965  09/24/2020  Mr. Kudelka was observed post Covid-19 immunization for 15 minutes without incident. He was provided with Vaccine Information Sheet and instruction to access the V-Safe system.   Mr. Ilsley was instructed to call 911 with any severe reactions post vaccine: Difficulty breathing  Swelling of face and throat  A fast heartbeat  A bad rash all over body  Dizziness and weakness   Immunizations Administered     Name Date Dose VIS Date Route   Moderna Covid-19 Booster Vaccine 09/24/2020  1:28 PM 0.25 mL 12/11/2019 Intramuscular   Manufacturer: Moderna   Lot: [id number]   RamerPO:[id number]

## 2020-10-01 DIAGNOSIS — G5603 Carpal tunnel syndrome, bilateral upper limbs: Secondary | ICD-10-CM | POA: Diagnosis not present

## 2020-10-20 DIAGNOSIS — G5603 Carpal tunnel syndrome, bilateral upper limbs: Secondary | ICD-10-CM | POA: Diagnosis not present

## 2020-11-05 ENCOUNTER — Other Ambulatory Visit: Payer: Self-pay

## 2020-11-05 MED ORDER — OXYCODONE HCL 5 MG PO TABS
ORAL_TABLET | ORAL | 0 refills | Status: DC
  Filled 2020-11-05: qty 10, 2d supply, fill #0

## 2020-11-05 MED ORDER — ONDANSETRON 4 MG PO TBDP
ORAL_TABLET | ORAL | 0 refills | Status: DC
  Filled 2020-11-05: qty 8, 4d supply, fill #0

## 2020-11-05 MED ORDER — IBUPROFEN 600 MG PO TABS
ORAL_TABLET | ORAL | 2 refills | Status: DC
  Filled 2020-11-05: qty 60, 20d supply, fill #0

## 2020-11-06 DIAGNOSIS — G5601 Carpal tunnel syndrome, right upper limb: Secondary | ICD-10-CM | POA: Diagnosis not present

## 2021-01-01 ENCOUNTER — Other Ambulatory Visit: Payer: Self-pay

## 2021-01-07 ENCOUNTER — Other Ambulatory Visit: Payer: Self-pay

## 2021-04-09 ENCOUNTER — Telehealth: Payer: Self-pay | Admitting: Cardiovascular Disease

## 2021-04-09 ENCOUNTER — Other Ambulatory Visit: Payer: Self-pay

## 2021-04-09 MED ORDER — LISINOPRIL-HYDROCHLOROTHIAZIDE 20-25 MG PO TABS
1.0000 | ORAL_TABLET | Freq: Every day | ORAL | 0 refills | Status: DC
  Filled 2021-04-09: qty 90, 90d supply, fill #0

## 2021-04-09 NOTE — Telephone Encounter (Signed)
Requested Prescriptions   Signed Prescriptions Disp Refills   lisinopril-hydrochlorothiazide (ZESTORETIC) 20-25 MG tablet 90 tablet 0    Sig: TAKE 1 TABLET BY MOUTH DAILY    Authorizing Provider: Kathlyn Sacramento A    Ordering User: Britt Bottom

## 2021-04-09 NOTE — Telephone Encounter (Signed)
°*  STAT* If patient is at the pharmacy, call can be transferred to refill team.   1. Which medications need to be refilled? (please list name of each medication and dose if known) lisinopril hctz 20-25 mg po q d   2. Which pharmacy/location (including street and city if local pharmacy) is medication to be sent to? [facility]   3. Do they need a 30 day or 90 day supply? [place]

## 2021-06-17 ENCOUNTER — Ambulatory Visit: Payer: BC Managed Care – PPO | Admitting: Cardiovascular Disease

## 2021-06-17 ENCOUNTER — Encounter: Payer: Self-pay | Admitting: Cardiovascular Disease

## 2021-06-17 VITALS — BP 148/100 | HR 89 | Ht 65.0 in | Wt 296.4 lb

## 2021-06-17 DIAGNOSIS — E785 Hyperlipidemia, unspecified: Secondary | ICD-10-CM

## 2021-06-17 DIAGNOSIS — I10 Essential (primary) hypertension: Secondary | ICD-10-CM

## 2021-06-17 NOTE — Progress Notes (Signed)
?  ?Cardiology Office Note ? ? ?Date:  06/17/2021  ? ?ID:  Tracy Lin, DOB [birth year], MRN [id number] ? ?PCP:  Pleas Koch, NP  ?Cardiologist:   Kathlyn Sacramento, MD  ? ?Chief Complaint  ?Patient presents with  ? Other  ?  12 month f/u. Meds reviewed verbally with pt.  ? ? ?  ?History of Present Illness: ?Tracy Lin is a 55 y.o. male who presents for a followup visit regarding hypertension and mildly dilated aortic root. He   He had an echocardiogram done in 2011 which showed normal LV systolic function, mild left ventricular hypertrophy, mildly dilated aortic root (4.1 cm) and mild mitral regurgitation. He does not smoke and drinks occasional alcohol.  ?Most recent echocardiogram in February 2016  showed normal LV systolic function, grade 1 diastolic dysfunction and mildly dilated aortic root at 40 mm.  ?Previous sleep study showed severe sleep apnea.   He also could not afford CPAP. ? ?CTA of the aorta was repeated and October 2021 which showed ectatic but not aneurysmal ascending thoracic aorta.  It was felt that the prior measurements were likely exaggerated by cardiac motion artifact. ? ?The patient has known history of whitecoat syndrome.  His blood pressure at home continues to be in the normal range on lisinopril-hydrochlorothiazide.  He has been doing well with no recent chest pain, shortness of breath or palpitations. ? ? ?Past Medical History:  ?Diagnosis Date  ? Hyperlipidemia   ? Hypertension   ? Obesity   ? ? ?Past Surgical History:  ?Procedure Laterality Date  ? CARPAL TUNNEL RELEASE Right   ? COLONOSCOPY WITH PROPOFOL N/A 05/23/2019  ? Procedure: COLONOSCOPY WITH PROPOFOL;  Surgeon: Jonathon Bellows, MD;  Location: [facility] ENDOSCOPY;  Service: Gastroenterology;  Laterality: N/A;  ? KNEE SURGERY Right   ? TRIGGER FINGER RELEASE Right   ? ? ? ?Current Outpatient Medications  ?Medication Sig Dispense Refill  ? COVID-19 mRNA vaccine, Moderna, 100 MCG/0.5ML injection Inject into the muscle. 0.25  mL 0  ? lisinopril-hydrochlorothiazide (ZESTORETIC) 20-25 MG tablet TAKE 1 TABLET BY MOUTH DAILY 90 tablet 0  ? ?No current facility-administered medications for this visit.  ? ? ?Allergies:   Patient has no known allergies.  ? ? ?Social History:  The patient  reports that he has never smoked. He has never used smokeless tobacco. He reports current alcohol use. He reports that he does not use drugs.  ? ?Family History:  The patient's family history includes Colon cancer in his mother; Diabetes in his father; Hypertension in his father and mother; Prostate cancer in his father.  ? ? ?ROS:  Please see the history of present illness.   Otherwise, review of systems are positive for none.   All other systems are reviewed and negative.  ? ? ?PHYSICAL EXAM: ?VS:  BP (!) 148/100 (BP Location: Left Arm, Patient Position: Sitting, Cuff Size: Large)   Pulse 89   Ht '5\' 5"'$  (1.651 m)   Wt 296 lb 6 oz (134.4 kg)   SpO2 98%   BMI 49.32 kg/m?  , BMI Body mass index is 49.32 kg/m?. ?GEN: Well nourished, well developed, in no acute distress  ?HEENT: normal  ?Neck: no JVD, carotid bruits, or masses ?Cardiac: RRR; no murmurs, rubs, or gallops,no edema  ?Respiratory:  clear to auscultation bilaterally, normal work of breathing ?GI: soft, nontender, nondistended, + BS ?MS: no deformity or atrophy  ?Skin: warm and dry, no rash ?Neuro:  Strength and sensation are intact ?  Psych: euthymic mood, full affect ? ? ?EKG:  EKG is ordered today. ?The ekg ordered today demonstrates normal sinus rhythm with moderate LVH and no significant ST or T wave changes. ? ? ?Recent Labs: ?No results found for requested labs within last 8760 hours.  ? ? ?Lipid Panel ?   ?Component Value Date/Time  ? CHOL 205 (H) 03/18/2019 2409  ? TRIG 73.0 03/18/2019 0938  ? HDL 55.20 03/18/2019 0938  ? CHOLHDL 4 03/18/2019 0938  ? VLDL 14.6 03/18/2019 0938  ? LDLCALC 135 (H) 03/18/2019 7353  ? ?  ? ?Wt Readings from Last 3 Encounters:  ?06/17/21 296 lb 6 oz (134.4 kg)   ?05/12/20 279 lb 4 oz (126.7 kg)  ?11/12/19 270 lb 6 oz (122.6 kg)  ?  ? ? ? ?   ? View : No data to display.  ?  ?  ?  ? ? ? ? ?ASSESSMENT AND PLAN: ? ?1.  Essential hypertension: The patient has known history of whitecoat syndrome.  Continue treatment with lisinopril-hydrochlorothiazide.  I requested routine labs on him today.   ? ?2.  Aortic root ectasia: Most recent CTA did not show aneurysm.  No need for follow-up on this. ? ?3.  Hyperlipidemia: His previous lipid profile showed an LDL of 135.  I requested fasting lipid and liver profile.  I discussed with him the option of obtaining coronary calcium score for risk stratification.  The patient wants to read about it and let us know. ? ? ?Disposition:   FU with me in 12 months. ? ?Signed, ? ?Kathlyn Sacramento, MD  ?06/17/2021 3:21 PM    ?[place] ?

## 2021-06-17 NOTE — Patient Instructions (Signed)
Medication Instructions:  ?Your physician recommends that you continue on your current medications as directed. Please refer to the Current Medication list given to you today. ? ?*If you need a refill on your cardiac medications before your next appointment, please call your pharmacy* ? ? ?Lab Work: ?Your physician recommends that you return for a FASTING lipid profile, cbc, cmp ? ?Please have your labs drawn at the [facility]. No appt needed. Lab hours are Mon-Fri 7am-6pm. ? ?If you have labs (blood work) drawn today and your tests are completely normal, you will receive your results only by: ?MyChart Message (if you have MyChart) OR ?A paper copy in the mail ?If you have any lab test that is abnormal or we need to change your treatment, we will call you to review the results. ? ? ?Testing/Procedures: ?None ordered ? ? ?Follow-Up: ?At [facility], you and your health needs are our priority.  As part of our continuing mission to provide you with exceptional heart care, we have created designated Provider Care Teams.  These Care Teams include your primary Cardiologist (physician) and Advanced Practice Providers (APPs -  Physician Assistants and Nurse Practitioners) who all work together to provide you with the care you need, when you need it. ? ?We recommend signing up for the patient portal called "MyChart".  Sign up information is provided on this After Visit Summary.  MyChart is used to connect with patients for Virtual Visits (Telemedicine).  Patients are able to view lab/test results, encounter notes, upcoming appointments, etc.  Non-urgent messages can be sent to your provider as well.   ?To learn more about what you can do with MyChart, go to [email or web address].   ? ?Your next appointment:   ?Your physician wants you to follow-up in: 1 year You will receive a reminder letter in the mail two months in advance. If you don't receive a letter, please call our office to schedule the follow-up  appointment. ? ? ?The format for your next appointment:   ?In Person ? ?Provider:   ?You may see Kathlyn Sacramento, MD or one of the following Advanced Practice Providers on your designated Care Team:   ?Murray Hodgkins, NP ?Christell Faith, PA-C ?Cadence Kathlen Mody, PA-C ? ? ?Other Instructions ? ?Coronary Calcium Scan ?A coronary calcium scan is an imaging test used to look for deposits of plaque in the inner lining of the blood vessels of the heart (coronary arteries). Plaque is made up of calcium, protein, and fatty substances. These deposits of plaque can partly clog and narrow the coronary arteries without producing any symptoms or warning signs. This puts a person at risk for a heart attack. ?A coronary calcium scan is performed using a computed tomography (CT) scanner machine without using a dye (contrast). ?This test is recommended for people who are at moderate risk for heart disease. The test can find plaque deposits before symptoms develop. ?Tell a health care provider about: ?Any allergies you have. ?All medicines you are taking, including vitamins, herbs, eye drops, creams, and over-the-counter medicines. ?Any problems you or family members have had with anesthetic medicines. ?Any bleeding problems you have. ?Any surgeries you have had. ?Any medical conditions you have. ?Whether you are pregnant or may be pregnant. ?What are the risks? ?Generally, this is a safe procedure. However, problems may occur, including: ?Harm to a pregnant woman and her unborn baby. This test involves the use of radiation. Radiation exposure can be dangerous to a pregnant woman and her unborn baby. If  you are pregnant or think you may be pregnant, you should not have this procedure done. ?A slight increase in the risk of cancer. This is because of the radiation involved in the test. The amount of radiation from one test is similar to the amount of radiation you are naturally exposed to over one year. ?What happens before the  procedure? ?Ask your health care provider for any specific instructions on how to prepare for this procedure. You may be asked to avoid products that contain caffeine, tobacco, or nicotine for 4 hours before the procedure. ?What happens during the procedure? ? ?You will undress and remove any jewelry from your neck or chest. You may need to remove hearing aides and dentures. Women may need to remove their bras. ?You will put on a hospital gown. ?Sticky electrodes will be placed on your chest. The electrodes will be connected to an electrocardiogram (ECG) machine to record a tracing of the electrical activity of your heart. ?You will lie down on your back on a curved bed that is attached to the [place]. ?You may be given medicine to slow down your heart rate so that clear pictures can be created. ?You will be moved into the CT scanner, and the CT scanner will take pictures of your heart. During this time, you will be asked to lie still and hold your breath for 10-20 seconds at a time while each picture of your heart is being taken. ?The procedure may vary among health care providers and hospitals. ?What can I expect after the procedure? ?You can return to your normal activities. ?It is up to you to get the results of your procedure. Ask your health care provider, or the department that is doing the procedure, when your results will be ready. ?Summary ?A coronary calcium scan is an imaging test used to look for deposits of plaque in the inner lining of the blood vessels of the heart. Plaque is made up of calcium, protein, and fatty substances. ?A coronary calcium scan is performed using a CT scanner machine without contrast. ?Generally, this is a safe procedure. Tell your health care provider if you are pregnant or may be pregnant. ?Ask your health care provider for any specific instructions on how to prepare for this procedure. ?You can return to your normal activities after the scan is done. ?This information is  not intended to replace advice given to you by your health care provider. Make sure you discuss any questions you have with your health care provider. ?Document Revised: 01/17/2021 Document Reviewed: 01/17/2021 ?Elsevier Patient Education ? [place]. ? ? ?Important Information About Sugar ? ? ? ? ? ? ?

## 2021-08-10 DIAGNOSIS — M65321 Trigger finger, right index finger: Secondary | ICD-10-CM | POA: Diagnosis not present

## 2021-08-10 DIAGNOSIS — M65311 Trigger thumb, right thumb: Secondary | ICD-10-CM | POA: Diagnosis not present

## 2021-08-10 DIAGNOSIS — G5602 Carpal tunnel syndrome, left upper limb: Secondary | ICD-10-CM | POA: Diagnosis not present

## 2021-09-09 ENCOUNTER — Other Ambulatory Visit: Payer: Self-pay

## 2021-09-09 MED ORDER — ONDANSETRON 4 MG PO TBDP
ORAL_TABLET | ORAL | 0 refills | Status: DC
  Filled 2021-09-09: qty 8, 4d supply, fill #0

## 2021-09-09 MED ORDER — IBUPROFEN 600 MG PO TABS
ORAL_TABLET | ORAL | 2 refills | Status: AC
  Filled 2021-09-09: qty 60, 20d supply, fill #0
  Filled 2022-01-05 – 2022-01-26 (×2): qty 60, 20d supply, fill #1
  Filled 2022-07-05: qty 60, 20d supply, fill #2

## 2021-09-09 MED ORDER — OXYCODONE HCL 5 MG PO TABS
ORAL_TABLET | ORAL | 0 refills | Status: DC
  Filled 2021-09-09: qty 10, 2d supply, fill #0

## 2021-09-10 DIAGNOSIS — G5602 Carpal tunnel syndrome, left upper limb: Secondary | ICD-10-CM | POA: Diagnosis not present

## 2021-10-08 ENCOUNTER — Other Ambulatory Visit: Payer: Self-pay | Admitting: Cardiovascular Disease

## 2021-10-08 ENCOUNTER — Other Ambulatory Visit: Payer: Self-pay

## 2021-10-08 MED ORDER — LISINOPRIL-HYDROCHLOROTHIAZIDE 20-25 MG PO TABS
1.0000 | ORAL_TABLET | Freq: Every day | ORAL | 1 refills | Status: DC
  Filled 2021-10-08: qty 90, 90d supply, fill #0
  Filled 2022-01-05 – 2022-01-26 (×2): qty 90, 90d supply, fill #1

## 2022-01-05 ENCOUNTER — Other Ambulatory Visit: Payer: Self-pay

## 2022-01-18 ENCOUNTER — Other Ambulatory Visit: Payer: Self-pay

## 2022-01-27 ENCOUNTER — Other Ambulatory Visit: Payer: Self-pay

## 2022-07-05 ENCOUNTER — Other Ambulatory Visit: Payer: Self-pay | Admitting: Cardiovascular Disease

## 2022-07-05 ENCOUNTER — Other Ambulatory Visit: Payer: Self-pay

## 2022-07-05 NOTE — Telephone Encounter (Signed)
Please schedule office visit for further refills. Thank you! 

## 2022-07-06 ENCOUNTER — Other Ambulatory Visit: Payer: Self-pay

## 2022-07-07 ENCOUNTER — Other Ambulatory Visit: Payer: Self-pay | Admitting: Cardiovascular Disease

## 2022-07-07 ENCOUNTER — Other Ambulatory Visit: Payer: Self-pay

## 2022-07-07 NOTE — Telephone Encounter (Signed)
Please schedule patient for yearly f/u for refill.  Thanks!

## 2022-07-07 NOTE — Telephone Encounter (Signed)
last visit 06/17/2021--FU with me in 12 months.   next visit none

## 2022-07-08 ENCOUNTER — Other Ambulatory Visit: Payer: Self-pay

## 2022-07-11 ENCOUNTER — Other Ambulatory Visit: Payer: Self-pay

## 2022-07-12 ENCOUNTER — Other Ambulatory Visit: Payer: Self-pay

## 2022-07-12 MED FILL — Lisinopril & Hydrochlorothiazide Tab 20-25 MG: ORAL | 30 days supply | Qty: 30 | Fill #0 | Status: AC

## 2022-09-02 NOTE — Telephone Encounter (Signed)
Patient is being seen on 9/5

## 2022-09-04 ENCOUNTER — Other Ambulatory Visit: Payer: Self-pay | Admitting: Cardiovascular Disease

## 2022-09-05 ENCOUNTER — Other Ambulatory Visit: Payer: Self-pay

## 2022-09-05 MED ORDER — LISINOPRIL-HYDROCHLOROTHIAZIDE 20-25 MG PO TABS
1.0000 | ORAL_TABLET | Freq: Every day | ORAL | 1 refills | Status: DC
  Filled 2022-09-05: qty 30, 30d supply, fill #0
  Filled 2022-10-12: qty 30, 30d supply, fill #1

## 2022-10-27 ENCOUNTER — Ambulatory Visit
Payer: No Typology Code available for payment source | Attending: Cardiovascular Disease | Admitting: Cardiovascular Disease

## 2022-10-27 ENCOUNTER — Encounter: Payer: Self-pay | Admitting: Cardiovascular Disease

## 2022-10-27 ENCOUNTER — Other Ambulatory Visit: Payer: Self-pay

## 2022-10-27 VITALS — BP 148/104 | HR 84 | Ht 65.0 in | Wt 297.2 lb

## 2022-10-27 DIAGNOSIS — I77810 Thoracic aortic ectasia: Secondary | ICD-10-CM

## 2022-10-27 DIAGNOSIS — E785 Hyperlipidemia, unspecified: Secondary | ICD-10-CM | POA: Diagnosis not present

## 2022-10-27 DIAGNOSIS — I10 Essential (primary) hypertension: Secondary | ICD-10-CM

## 2022-10-27 LAB — CBC
Hematocrit: 40.9 % (ref 37.5–51.0)
Hemoglobin: 13.2 g/dL (ref 13.0–17.7)
MCH: 28.3 pg (ref 26.6–33.0)
MCHC: 32.3 g/dL (ref 31.5–35.7)
MCV: 88 fL (ref 79–97)
Platelets: 204 10*3/uL (ref 150–450)
RBC: 4.66 x10E6/uL (ref 4.14–5.80)
RDW: 13.1 % (ref 11.6–15.4)
WBC: 4.8 10*3/uL (ref 3.4–10.8)

## 2022-10-27 LAB — LIPID PANEL
Chol/HDL Ratio: 3.8 ratio (ref 0.0–5.0)
Cholesterol, Total: 212 mg/dL — ABNORMAL HIGH (ref 100–199)
HDL: 56 mg/dL (ref >39)
LDL Chol Calc (NIH): 144 mg/dL — ABNORMAL HIGH (ref 0–99)
Triglycerides: 69 mg/dL (ref 0–149)
VLDL Cholesterol Cal: 12 mg/dL (ref 5–40)

## 2022-10-27 LAB — COMPREHENSIVE METABOLIC PANEL
ALT: 18 IU/L (ref 0–44)
AST: 16 IU/L (ref 0–40)
Albumin: 4.3 g/dL (ref 3.8–4.9)
Alkaline Phosphatase: 95 IU/L (ref 44–121)
BUN/Creatinine Ratio: 15 (ref 9–20)
BUN: 16 mg/dL (ref 6–24)
Bilirubin Total: 0.3 mg/dL (ref 0.0–1.2)
CO2: 25 mmol/L (ref 20–29)
Calcium: 9.6 mg/dL (ref 8.7–10.2)
Chloride: 100 mmol/L (ref 96–106)
Creatinine, Ser: 1.07 mg/dL (ref 0.76–1.27)
Globulin, Total: 3 g/dL (ref 1.5–4.5)
Glucose: 92 mg/dL (ref 70–99)
Potassium: 4.2 mmol/L (ref 3.5–5.2)
Sodium: 138 mmol/L (ref 134–144)
Total Protein: 7.3 g/dL (ref 6.0–8.5)
eGFR: 82 mL/min/{1.73_m2} (ref >59)

## 2022-10-27 LAB — TSH: TSH: 0.77 u[IU]/mL (ref 0.450–4.500)

## 2022-10-27 MED ORDER — LOSARTAN POTASSIUM-HCTZ 100-25 MG PO TABS
1.0000 | ORAL_TABLET | Freq: Every day | ORAL | 1 refills | Status: DC
  Filled 2022-10-27: qty 90, 90d supply, fill #0
  Filled 2023-01-31: qty 90, 90d supply, fill #1

## 2022-10-27 NOTE — Patient Instructions (Signed)
Medication Instructions:  STOP the Lisinopril-Hydrochlorothiazide  START Losartan-Hydrochlorothiazide 100-25mg  once daily  *If you need a refill on your cardiac medications before your next appointment, please call your pharmacy*   Lab Work: Your provider would like for you to have following labs drawn today CBC. CMET, Lipid and TSH    If you have labs (blood work) drawn today and your tests are completely normal, you will receive your results only by: MyChart Message (if you have MyChart) OR A paper copy in the mail If you have any lab test that is abnormal or we need to change your treatment, we will call you to review the results.   Testing/Procedures: Your physician has recommended that you have CT Coronary Calcium Score.  - $99 out of pocket cost at the time of your test - Call [phone] to schedule at your convenience.  [facility] [address] [place], [place] [address]   Coronary CalciumScan A coronary calcium scan is an imaging test used to look for deposits of calcium and other fatty materials (plaques) in the inner lining of the blood vessels of the heart (coronary arteries). These deposits of calcium and plaques can partly clog and narrow the coronary arteries without producing any symptoms or warning signs. This puts a person at risk for a heart attack. This test can detect these deposits before symptoms develop. Tell a health care provider about: Any allergies you have. All medicines you are taking, including vitamins, herbs, eye drops, creams, and over-the-counter medicines. Any problems you or family members have had with anesthetic medicines. Any blood disorders you have. Any surgeries you have had. Any medical conditions you have. Whether you are pregnant or may be pregnant. What are the risks? Generally, this is a safe procedure. However, problems may occur, including: Harm to a pregnant woman and her unborn  baby. This test involves the use of radiation. Radiation exposure can be dangerous to a pregnant woman and her unborn baby. If you are pregnant, you generally should not have this procedure done. Slight increase in the risk of cancer. This is because of the radiation involved in the test. What happens before the procedure? No preparation is needed for this procedure. What happens during the procedure? You will undress and remove any jewelry around your neck or chest. You will put on a hospital gown. Sticky electrodes will be placed on your chest. The electrodes will be connected to an electrocardiogram (ECG) machine to record a tracing of the electrical activity of your heart. A CT scanner will take pictures of your heart. During this time, you will be asked to lie still and hold your breath for 2-3 seconds while a picture of your heart is being taken. The procedure may vary among health care providers and hospitals. What happens after the procedure? You can get dressed. You can return to your normal activities. It is up to you to get the results of your test. Ask your health care provider, or the department that is doing the test, when your results will be ready. Summary A coronary calcium scan is an imaging test used to look for deposits of calcium and other fatty materials (plaques) in the inner lining of the blood vessels of the heart (coronary arteries). Generally, this is a safe procedure. Tell your health care provider if you are pregnant or may be pregnant. No preparation is needed for this procedure. A CT scanner will take pictures of your heart. You can return  to your normal activities after the scan is done. This information is not intended to replace advice given to you by your health care provider. Make sure you discuss any questions you have with your health care provider. Document Released: 08/06/2007 Document Revised: 12/28/2015 Document Reviewed: 12/28/2015 Elsevier Interactive  Patient Education  2017 [employer].    Follow-Up: At [facility], you and your health needs are our priority.  As part of our continuing mission to provide you with exceptional heart care, we have created designated Provider Care Teams.  These Care Teams include your primary Cardiologist (physician) and Advanced Practice Providers (APPs -  Physician Assistants and Nurse Practitioners) who all work together to provide you with the care you need, when you need it.  We recommend signing up for the patient portal called "MyChart".  Sign up information is provided on this After Visit Summary.  MyChart is used to connect with patients for Virtual Visits (Telemedicine).  Patients are able to view lab/test results, encounter notes, upcoming appointments, etc.  Non-urgent messages can be sent to your provider as well.   To learn more about what you can do with MyChart, go to [email or web address].    Your next appointment:   6 month(s)  Provider:   You may see Dr. Kirke Corin or one of the following Advanced Practice Providers on your designated Care Team:   Nicolasa Ducking, NP Eula Listen, PA-C Cadence Fransico Michael, PA-C Charlsie Quest, NP

## 2022-10-27 NOTE — Progress Notes (Signed)
Cardiology Office Note   Date:  10/27/2022   ID:  Tracy Lin, DOB 1965/06/30, MRN [id number]  PCP:  Doreene Nest, NP  Cardiologist:   Lorine Bears, MD   Chief Complaint  Patient presents with   Follow-up    12 month f/u c/o Elevated BP. Meds reviewed verbally with pt.      History of Present Illness: Tracy Lin is a 57 y.o. male who presents for a followup visit regarding hypertension and mildly dilated aortic root. He   He had an echocardiogram done in 2011 which showed normal LV systolic function, mild left ventricular hypertrophy, mildly dilated aortic root (4.1 cm) and mild mitral regurgitation. He does not smoke and drinks occasional alcohol.  Most recent echocardiogram in February 2016  showed normal LV systolic function, grade 1 diastolic dysfunction and mildly dilated aortic root at 40 mm.  Previous sleep study showed severe sleep apnea.   He also could not afford CPAP.  CTA of the aorta was repeated and October 2021 which showed ectatic but not aneurysmal ascending thoracic aorta.  It was felt that the prior measurements were likely exaggerated by cardiac motion artifact.  The patient has known history of whitecoat syndrome.  He reports that his blood pressure has been running high even at home.  He denies chest pain or shortness of breath.  He has not been able to lose any weight.  He continues to work at [employer].  He does not exercise on a regular basis.   Past Medical History:  Diagnosis Date   Hyperlipidemia    Hypertension    Obesity     Past Surgical History:  Procedure Laterality Date   CARPAL TUNNEL RELEASE Right    COLONOSCOPY WITH PROPOFOL N/A 05/23/2019   Procedure: COLONOSCOPY WITH PROPOFOL;  Surgeon: Wyline Mood, MD;  Location: [facility] ENDOSCOPY;  Service: Gastroenterology;  Laterality: N/A;   KNEE SURGERY Right    TRIGGER FINGER RELEASE Right      Current Outpatient Medications  Medication Sig Dispense Refill   COVID-19  mRNA vaccine, Moderna, 100 MCG/0.5ML injection Inject into the muscle. 0.25 mL 0   ibuprofen (ADVIL) 600 MG tablet Take 1 tablet 3 times a day by oral route. 60 tablet 2   lisinopril-hydrochlorothiazide (ZESTORETIC) 20-25 MG tablet Take 1 tablet by mouth daily. Please keep appointment for further refills. Thank you! 30 tablet 1   ondansetron (ZOFRAN-ODT) 4 MG disintegrating tablet Take one tablet po q 12 prn nausea. 8 tablet 0   oxyCODONE (OXY IR/ROXICODONE) 5 MG immediate release tablet Take 1 tablet every 4 hours by oral route. 10 tablet 0   No current facility-administered medications for this visit.    Allergies:   Patient has no known allergies.    Social History:  The patient  reports that he has never smoked. He has never used smokeless tobacco. He reports current alcohol use. He reports that he does not use drugs.   Family History:  The patient's family history includes Colon cancer in his mother; Diabetes in his father; Hypertension in his father and mother; Prostate cancer in his father.    ROS:  Please see the history of present illness.   Otherwise, review of systems are positive for none.   All other systems are reviewed and negative.    PHYSICAL EXAM: VS:  BP (!) 148/104 (BP Location: Left Arm, Patient Position: Sitting, Cuff Size: Large)   Pulse 84   Ht 5\' 5"  (1.651 m)  Wt 297 lb 4 oz (134.8 kg)   SpO2 96%   BMI 49.46 kg/m  , BMI Body mass index is 49.46 kg/m. GEN: Well nourished, well developed, in no acute distress  HEENT: normal  Neck: no JVD, carotid bruits, or masses Cardiac: RRR; no murmurs, rubs, or gallops,no edema  Respiratory:  clear to auscultation bilaterally, normal work of breathing GI: soft, nontender, nondistended, + BS MS: no deformity or atrophy  Skin: warm and dry, no rash Neuro:  Strength and sensation are intact Psych: euthymic mood, full affect   EKG:  EKG is ordered today. The ekg ordered today demonstrates: Normal sinus  rhythm Moderate voltage criteria for LVH, may be normal variant ( R in aVL , Cornell product ) No previous ECGs available    Recent Labs: No results found for requested labs within last 365 days.    Lipid Panel    Component Value Date/Time   CHOL 205 (H) 03/18/2019 0938   TRIG 73.0 03/18/2019 0938   HDL 55.20 03/18/2019 0938   CHOLHDL 4 03/18/2019 0938   VLDL 14.6 03/18/2019 0938   LDLCALC 135 (H) 03/18/2019 0938      Wt Readings from Last 3 Encounters:  10/27/22 297 lb 4 oz (134.8 kg)  06/17/21 296 lb 6 oz (134.4 kg)  05/12/20 279 lb 4 oz (126.7 kg)           No data to display            ASSESSMENT AND PLAN:  1.  Essential hypertension: The patient has known history of whitecoat syndrome.  His blood pressure has not been well-controlled even at home.  I elected to switch lisinopril hydrochlorothiazide to losartan hydrochlorothiazide 100/25 mg once daily.  Check routine labs today.  If blood pressure remains elevated, we will plan on adding amlodipine.  2.  Aortic root ectasia: Most recent CTA did not show aneurysm.  No need for follow-up on this.  3.  Hyperlipidemia: His previous lipid profile showed an LDL of 135.  I requested fasting lipid and liver profile.  I requested coronary calcium score for risk stratification.  He is agreeable.  4.  Sleep apnea: He could not afford CPAP in the past but this might have to be addressed again in the near future.  5.  Morbid obesity: BMI of 49.5.  I had a prolonged discussion with him about the importance of integrating exercise into his daily routine and following a healthier diet.    Disposition:   FU with me in 6 months.  Signed,  Lorine Bears, MD  10/27/2022 9:22 AM    [facility]

## 2022-10-31 ENCOUNTER — Ambulatory Visit
Admission: RE | Admit: 2022-10-31 | Discharge: 2022-10-31 | Disposition: A | Discharge status: Home / Self Care | Payer: No Typology Code available for payment source | Source: Ambulatory Visit | Attending: Cardiovascular Disease | Admitting: Cardiovascular Disease

## 2022-10-31 DIAGNOSIS — E785 Hyperlipidemia, unspecified: Secondary | ICD-10-CM | POA: Insufficient documentation

## 2022-12-30 DIAGNOSIS — M67431 Ganglion, right wrist: Secondary | ICD-10-CM | POA: Diagnosis not present

## 2023-01-12 DIAGNOSIS — M25531 Pain in right wrist: Secondary | ICD-10-CM | POA: Diagnosis not present

## 2023-02-08 ENCOUNTER — Other Ambulatory Visit: Payer: Self-pay

## 2023-02-24 DIAGNOSIS — M1712 Unilateral primary osteoarthritis, left knee: Secondary | ICD-10-CM | POA: Diagnosis not present

## 2023-05-08 ENCOUNTER — Other Ambulatory Visit: Payer: Self-pay

## 2023-05-08 ENCOUNTER — Other Ambulatory Visit: Payer: Self-pay | Admitting: Cardiovascular Disease

## 2023-05-08 MED ORDER — LOSARTAN POTASSIUM-HCTZ 100-25 MG PO TABS
1.0000 | ORAL_TABLET | Freq: Every day | ORAL | 0 refills | Status: DC
  Filled 2023-05-08: qty 90, 90d supply, fill #0

## 2023-07-24 NOTE — Progress Notes (Deleted)
  Cardiology Office Note   Date:  07/24/2023  ID:  Tracy Lin, DOB Mar 25, 1965, MRN [id number] PCP: Gabriel John, NP  [facility] Providers Cardiologist:  None { Click to update primary MD,subspecialty MD or APP then REFRESH:1}    History of Present Illness Tracy Lin is a 58 y.o. male ***  ROS: ***  Studies Reviewed      *** Risk Assessment/Calculations {Does this patient have ATRIAL FIBRILLATION?:[phone]} No BP recorded.  {Refresh Note OR Click here to enter BP  :1}***       Physical Exam VS:  There were no vitals taken for this visit.   Wt Readings from Last 3 Encounters:  10/27/22 297 lb 4 oz (134.8 kg)  06/17/21 296 lb 6 oz (134.4 kg)  05/12/20 279 lb 4 oz (126.7 kg)    GEN: Well nourished, well developed in no acute distress NECK: No JVD; No carotid bruits CARDIAC: ***RRR, no murmurs, rubs, gallops RESPIRATORY:  Clear to auscultation without rales, wheezing or rhonchi  ABDOMEN: Soft, non-tender, non-distended EXTREMITIES:  No edema; No deformity   ASSESSMENT AND PLAN ***    {Are you ordering a CV Procedure (e.g. stress test, cath, DCCV, TEE, etc)?   Press F2        :[id number]}  Dispo: ***  Signed, Fynn Vanblarcom, NP

## 2023-07-25 ENCOUNTER — Ambulatory Visit: Admitting: Cardiology

## 2023-08-03 ENCOUNTER — Ambulatory Visit: Attending: Cardiology | Admitting: Cardiology

## 2023-08-03 ENCOUNTER — Other Ambulatory Visit: Payer: Self-pay

## 2023-08-03 ENCOUNTER — Telehealth: Payer: Self-pay | Admitting: *Deleted

## 2023-08-03 ENCOUNTER — Encounter: Payer: Self-pay | Admitting: Cardiology

## 2023-08-03 VITALS — BP 130/75 | HR 89 | Ht 65.0 in | Wt 299.2 lb

## 2023-08-03 DIAGNOSIS — I10 Essential (primary) hypertension: Secondary | ICD-10-CM | POA: Diagnosis not present

## 2023-08-03 DIAGNOSIS — E782 Mixed hyperlipidemia: Secondary | ICD-10-CM

## 2023-08-03 DIAGNOSIS — G4730 Sleep apnea, unspecified: Secondary | ICD-10-CM | POA: Diagnosis not present

## 2023-08-03 MED ORDER — LOSARTAN POTASSIUM-HCTZ 100-25 MG PO TABS
1.0000 | ORAL_TABLET | Freq: Every day | ORAL | 0 refills | Status: DC
  Filled 2023-08-03 – 2023-08-30 (×2): qty 90, 90d supply, fill #0

## 2023-08-03 NOTE — Telephone Encounter (Signed)
 Ordering provider: SHERI  HAMMOCK Associated diagnoses: G47.30 WatchPAT PA obtained on 08/03/2023 by Gaylene Kays, CMA. Authorization: No; tracking ID  NO PA REQ FOR HST  Patient notified of PIN ([id number]) on 08/03/2023 via Notification Method: phone.

## 2023-08-03 NOTE — Patient Instructions (Signed)
 Medication Instructions:  Your physician recommends that you continue on your current medications as directed. Please refer to the Current Medication list given to you today.   *If you need a refill on your cardiac medications before your next appointment, please call your pharmacy*  Lab Work: Your provider would like for you to have following labs drawn today CBC BMP LDL.   If you have labs (blood work) drawn today and your tests are completely normal, you will receive your results only by: MyChart Message (if you have MyChart) OR A paper copy in the mail If you have any lab test that is abnormal or we need to change your treatment, we will call you to review the results.  Testing/Procedures: WatchPAT?  Is a FDA cleared portable home sleep study test that uses a watch and 3 points of contact to monitor 7 different channels, including your heart rate, oxygen saturations, body position, snoring, and chest motion.  The study is easy to use from the comfort of your own home and accurately detect sleep apnea.  Before bed, you attach the chest sensor, attached the sleep apnea bracelet to your nondominant hand, and attach the finger probe.  After the study, the raw data is downloaded from the watch and scored for apnea events.   For more information: [email or web address]  Patient Testing Instructions:  Do not put battery into the device until bedtime when you are ready to begin the test. Please call the support number if you need assistance after following the instructions below: 24 hour support line- [phone] or [facility] support at [phone] (option 2)  Download the [employer] WatchPAT One app through the google play store or App Store  Be sure to turn on or enable access to bluetooth in settlings on your smartphone/ device  Make sure no other bluetooth devices are on and within the vicinity of your smartphone/ device and WatchPAT watch during testing.  Make sure to leave your  smart phone/ device plugged in and charging all night.  When ready for bed:  Follow the instructions step by step in the WatchPAT One App to activate the testing device. For additional instructions, including video instruction, visit the WatchPAT One video on Youtube. You can search for WatchPat One within Youtube (video is 4 minutes and 18 seconds) or enter: https://youtube/watch?[email or web address]_vbiwxE Please note: You will be prompted to enter a Pin to connect via bluetooth when starting the test. The PIN will be assigned to you when you receive the test.  The device is disposable, but it recommended that you retain the device until you receive a call letting you know the study has been received and the results have been interpreted.  We will let you know if the study did not transmit to us  properly after the test is completed. You do not need to call us  to confirm the receipt of the test.  Please complete the test within 48 hours of receiving PIN.   Frequently Asked Questions:  What is Watch Deatra Face one?  A single use fully disposable home sleep apnea testing device and will not need to be returned after completion.  What are the requirements to use WatchPAT one?  The be able to have a successful watchpat one sleep study, you should have your Watch pat one device, your smart phone, watch pat one app, your PIN number and Internet access What type of phone do I need?  You should have a smart phone that uses Android 5.1 and above or any Iphone  with IOS 10 and above How can I download the WatchPAT one app?  Based on your device type search for WatchPAT one app either in google play for android devices or APP store for Iphone's Where will I get my PIN for the study?  Your PIN will be provided by your physician's office. It is used for authentication and if you lose/forget your PIN, please reach out to your providers office.  I do not have Internet at home. Can I do WatchPAT one study?  WatchPAT One needs  Internet connection throughout the night to be able to transmit the sleep data. You can use your home/local internet or your cellular's data package. However, it is always recommended to use home/local Internet. It is estimated that between 20MB-30MB will be used with each study.However, the application will be looking for space in the phone to start the study.  What happens if I lose internet or bluetooth connection?  During the internet disconnection, your phone will not be able to transmit the sleep data. All the data, will be stored in your phone. As soon as the internet connection is back on, the phone will being sending the sleep data. During the bluetooth disconnection, WatchPAT one will not be able to to send the sleep data to your phone. Data will be kept in the WatchPAT one until two devices have bluetooth connection back on. As soon as the connection is back on, WatchPAT one will send the sleep data to the phone.  How long do I need to wear the WatchPAT one?  After you start the study, you should wear the device at least 6 hours.  How far should I keep my phone from the device?  During the night, your phone should be within 15 feet.  What happens if I leave the room for restroom or other reasons?  Leaving the room for any reason will not cause any problem. As soon as your get back to the room, both devices will reconnect and will continue to send the sleep data. Can I use my phone during the sleep study?  Yes, you can use your phone as usual during the study. But it is recommended to put your watchpat one on when you are ready to go to bed.  How will I get my study results?  A soon as you completed your study, your sleep data will be sent to the provider. They will then share the results with you when they are ready.    Follow-Up: At [facility], you and your health needs are our priority.  As part of our continuing mission to provide you with exceptional heart care, our  providers are all part of one team.  This team includes your primary Cardiologist (physician) and Advanced Practice Providers or APPs (Physician Assistants and Nurse Practitioners) who all work together to provide you with the care you need, when you need it.  Your next appointment:   12 month(s)  Provider:   Ronald Cockayne, NP

## 2023-08-03 NOTE — Telephone Encounter (Signed)
-----   Message from Nurse Shelvy Dickens R sent at 08/03/2023  2:40 PM EDT ----- Order Barabara Boning for pt

## 2023-08-03 NOTE — Progress Notes (Signed)
 Cardiology Office Note   Date:  08/03/2023  ID:  Tracy Lin, DOB October 15, 1965, MRN [id number] PCP: Gabriel John, NP  [facility] Providers Cardiologist:  None     History of Present Illness Tracy Lin is a 58 y.o. male with a past medical history of hypertension, mildly dilated aortic root, hyperlipidemia, morbid obesity, who is here today for follow-up.  Had previously undergone echocardiogram in 2011 which showed normal LV systolic function, mild left ventricular hypertrophy, mildly dilated aortic root 4.1 cm and mild mitral regurgitation.  Repeat echocardiogram in February 2016 showed normal LV function, G1 DD, mildly dilated aorta greater than 40 mm.  Prior sleep study showed severe sleep apnea unfortunately was unable to afford CPAP.  CT of the aorta reported completed in October 2021 showed ectatic but not aneurysmal ascending thoracic aorta was felt to be prior measurements were likely exacerbated by cardiac motion.  He was last seen in clinic 11/03/2022 by Dr.Arida.  He reported that his blood pressure been running high even at home.  Denied any chest pain or shortness of breath.  He not been able to lose any weight.  Continue to work due to his pain but did not have a regular exercise regimen.  His lisinopril  and HCTZ was switched to losartan /HCTZ 100/25 mg once daily.  He was sent for labs.  If his blood pressure continue to remain elevated can add amlodipine .  Coronary calcium score for risk stratification was suggested as well.  He returns to clinic today stating overall his cardiac perspective he is doing well.  He denies any chest pain, shortness of breath, palpitations or peripheral edema.  He did not sustain any injury and said he had something pop and felt something pop in his knee but was advised that it is arthritis denies any present today.  He has been compliant with his current medication regimen without adverse side effects.  He does complain of not being  able to sleep and previously underwent sleep study.  He had severe sleep apnea.  Was unable to afford CPAP machine.  Denies any hospitalizations or visits to the emergency department.  ROS: 10 point review of system has been reviewed and considered negative except ones been listed in the HPI  Studies Reviewed EKG Interpretation Date/Time:  Thursday August 03 2023 14:12:31 EDT Ventricular Rate:  89 PR Interval:  190 QRS Duration:  92 QT Interval:  374 QTC Calculation: 455 R Axis:   -26  Text Interpretation: Normal sinus rhythm Moderate voltage criteria for LVH, may be normal variant ( R in aVL , Cornell product ) When compared with ECG of 27-Oct-2022 09:15, No significant change was found Confirmed by Tracy Lin ([id number]) on 08/03/2023 2:17:48 PM    Coronary calcium score 10/31/2022 IMPRESSION AND RECOMMENDATION: 1. Coronary calcium score of 0.   2. CAC 0, CAC-DRS A0.   3. Continue heart healthy lifestyle and risk factor modification.  2D echo 04/01/2014 Study Conclusions  - Left ventricle: The cavity size was normal. There was mild    concentric hypertrophy. Systolic function was vigorous. The    estimated ejection fraction was in the range of 65% to 70%. Wall    motion was normal; there were no regional wall motion    abnormalities. Doppler parameters are consistent with abnormal    left ventricular relaxation (grade 1 diastolic dysfunction).  - Aorta: Aortic root dimension: 40 mm (ED).  - Aortic root: The aortic root was mildly dilated.  Risk Assessment/Calculations  STOP-BANG RISK ASSESSMENT       08/03/2023    3:54 PM  STOP-BANG  Do you snore loudly? Yes  Do you often feel tired, fatigued, or sleepy during the daytime? Yes  Has anyone observed you stop breathing during sleep? Yes  Do you have (or are you being treated for) high blood pressure? Yes  Recent BMI (Calculated) 49.79  Is BMI greater than 35 kg/m2? 1=Yes  Age older than 56 years old? 1=Yes  Has large neck  size > 40 cm (15.7 in, large male shirt size, large male collar size > 16) Yes  Gender - Male 1=Yes  STOP-Bang Total Score 8      If STOP-BANG Score >=3 OR two clinical symptoms - patient qualifies for WatchPAT (CPT 95800)      Sleep study ordered due to two (2) of the following clinical symptoms/diagnoses:  Excessive daytime sleepiness G47.10  Gastroesophageal reflux K21.9  Nocturia R35.1  Morning Headaches G44.221  Difficulty concentrating R41.840  Memory problems or poor judgment G31.84  Personality changes or irritability R45.4  Loud snoring R06.83  Depression F32.9  Unrefreshed by sleep G47.8  Impotence N52.9  History of high blood pressure R03.0  Insomnia G47.00  Sleep Disordered Breathing or Sleep Apnea ICD G47.33         STOP-Bang Score:  8      Physical Exam VS:  BP 130/75 (BP Location: Left Arm, Patient Position: Sitting, Cuff Size: Large)   Pulse 89   Ht 5' 5 (1.651 m)   Wt 299 lb 3.2 oz (135.7 kg)   SpO2 96%   BMI 49.79 kg/m    Wt Readings from Last 3 Encounters:  08/03/23 299 lb 3.2 oz (135.7 kg)  10/27/22 297 lb 4 oz (134.8 kg)  06/17/21 296 lb 6 oz (134.4 kg)    GEN: Well nourished, well developed in no acute distress NECK: Unable to determine JVD due to body habitus; No carotid bruits CARDIAC: RRR, no murmurs, rubs, gallops RESPIRATORY:  Clear to auscultation without rales, wheezing or rhonchi  ABDOMEN: Soft, non-tender, obese, non-distended EXTREMITIES:  No edema; No deformity   ASSESSMENT AND PLAN Primary hypertension with blood pressure today 130/70.  Blood pressure has been well-controlled on current medication regimen.  He has been continued on losartan  potassium 100/25 mg daily.  With refills sent to the pharmacy of choice at patient's request.  He has been sent for an updated CBC and a BMP today as well.  He has been encouraged to continue to monitor blood pressures 1 to 2 hours post-medication administration.  Mixed hyperlipidemia with an LDL  of 144.  He has been sent for direct LDL today.  10-year ASCVD risk of 11.1% with moderate intensity statin is recommended for patients with an LDL-C of 70-1 89.  He is being sent for an updated direct LDL today.  If continues to be elevated we will call patient to initiate statin therapy with repeat hepatic and lipid panel in 8 to 10 weeks.  Sleep altered breathing with a Stop Bang score of 8 with previously being diagnosed with severe obstructive sleep apnea with a history of sleep study being completed and advised he needs CPAP but was unable to afford it.  He has been advised that with the amount of time that is passed he would need an updated sleep study. WatchPat ordered today.  Morbid obesity with BMI 49.79. Would benefit from weight loss.  Encouraged to continue with dietary modification and increase activity.  Dispo: Patient to return to clinic to see MD/APP in 11 to 12 months or sooner if needed for reevaluation.  Signed, Sammuel Blick, NP

## 2023-08-04 LAB — BASIC METABOLIC PANEL WITH GFR
BUN/Creatinine Ratio: 15 (ref 9–20)
BUN: 15 mg/dL (ref 6–24)
CO2: 22 mmol/L (ref 20–29)
Calcium: 9.8 mg/dL (ref 8.7–10.2)
Chloride: 100 mmol/L (ref 96–106)
Creatinine, Ser: 1.03 mg/dL (ref 0.76–1.27)
Glucose: 83 mg/dL (ref 70–99)
Potassium: 4 mmol/L (ref 3.5–5.2)
Sodium: 140 mmol/L (ref 134–144)
eGFR: 86 mL/min/{1.73_m2} (ref >59)

## 2023-08-04 LAB — LDL CHOLESTEROL, DIRECT: LDL Direct: 144 mg/dL — ABNORMAL HIGH (ref 0–99)

## 2023-08-04 LAB — CBC
Hematocrit: 43.9 % (ref 37.5–51.0)
Hemoglobin: 13.9 g/dL (ref 13.0–17.7)
MCH: 29 pg (ref 26.6–33.0)
MCHC: 31.7 g/dL (ref 31.5–35.7)
MCV: 92 fL (ref 79–97)
Platelets: 210 10*3/uL (ref 150–450)
RBC: 4.79 x10E6/uL (ref 4.14–5.80)
RDW: 13 % (ref 11.6–15.4)
WBC: 4.8 10*3/uL (ref 3.4–10.8)

## 2023-08-04 NOTE — Telephone Encounter (Signed)
 Called patient to advise WATCHPAT has been approved - patient will come by today or Monday to pick up device

## 2023-08-07 NOTE — Telephone Encounter (Signed)
 Called patient to remind him to stop by the office to pick up WatchPat - left VM with office number

## 2023-08-08 ENCOUNTER — Ambulatory Visit: Payer: Self-pay | Admitting: Cardiology

## 2023-08-08 DIAGNOSIS — E785 Hyperlipidemia, unspecified: Secondary | ICD-10-CM

## 2023-08-08 MED ORDER — ROSUVASTATIN CALCIUM 20 MG PO TABS: 20.0000 mg | ORAL_TABLET | Freq: Every day | ORAL | 3 refills | Status: AC

## 2023-08-08 NOTE — Telephone Encounter (Signed)
 Called patient to pick up watchpat - left message on VM

## 2023-08-08 NOTE — Progress Notes (Signed)
 LDL or bad cholesterol is 098.  His ASCVD risk is 11.1%.  Per the [employer] of cardiology is recommended he initiate moderate intensity statin.  Recommend starting rosuvastatin 20 mg daily at bedtime.  Repeat hepatic and lipid panel in 6 to 8 weeks.

## 2023-08-15 ENCOUNTER — Encounter: Payer: Self-pay | Admitting: Primary Care

## 2023-08-15 ENCOUNTER — Ambulatory Visit (INDEPENDENT_AMBULATORY_CARE_PROVIDER_SITE_OTHER): Admitting: Primary Care

## 2023-08-15 ENCOUNTER — Ambulatory Visit: Payer: Self-pay | Admitting: Primary Care

## 2023-08-15 ENCOUNTER — Other Ambulatory Visit: Payer: Self-pay | Admitting: Primary Care

## 2023-08-15 VITALS — BP 122/68 | HR 72 | Temp 97.2°F | Ht 65.0 in | Wt 300.0 lb

## 2023-08-15 DIAGNOSIS — Z125 Encounter for screening for malignant neoplasm of prostate: Secondary | ICD-10-CM | POA: Diagnosis not present

## 2023-08-15 DIAGNOSIS — R7303 Prediabetes: Secondary | ICD-10-CM

## 2023-08-15 DIAGNOSIS — I10 Essential (primary) hypertension: Secondary | ICD-10-CM

## 2023-08-15 DIAGNOSIS — E785 Hyperlipidemia, unspecified: Secondary | ICD-10-CM | POA: Diagnosis not present

## 2023-08-15 DIAGNOSIS — Z0000 Encounter for general adult medical examination without abnormal findings: Secondary | ICD-10-CM | POA: Diagnosis not present

## 2023-08-15 DIAGNOSIS — Z23 Encounter for immunization: Secondary | ICD-10-CM | POA: Diagnosis not present

## 2023-08-15 LAB — PSA: PSA: 0.74 ng/mL (ref 0.10–4.00)

## 2023-08-15 LAB — HEMOGLOBIN A1C: Hgb A1c MFr Bld: 5.9 % (ref 4.6–6.5)

## 2023-08-15 NOTE — Assessment & Plan Note (Signed)
 Discussed purpose for statin therapy as he has several questions today. We discussed his ASCVD risk for future heart disease. Reviewed coronary CT scan from 2024 which showed a calcium score of 0.  He has decided to defer treatment with rosuvastatin. He will work on lifestyle changes.

## 2023-08-15 NOTE — Assessment & Plan Note (Signed)
 First Shingrix vaccine provided today, discussed instructions for second Shingrix vaccine Colonoscopy UTD, due 2026 PSA due and pending.  Discussed the importance of a healthy diet and regular exercise in order for weight loss, and to reduce the risk of further co-morbidity.  Exam stable. Labs pending.  Follow up in 1 year for repeat physical.

## 2023-08-15 NOTE — Progress Notes (Signed)
 Subjective:    Patient ID: Tracy Lin, male    DOB: 06-25-1965, 58 y.o.   MRN: [id number]  HPI  Tracy Lin is a very pleasant 58 y.o. male who presents today to reestablish care and for complete physical and follow up of chronic conditions.  He has not been seen in our office since October 2020.  Immunizations: -Tetanus: Completed in 2017 -Shingles: Never completed   Diet: Fair diet.  Exercise: No regular exercise.  Eye exam: Completed >1 year ago  Dental exam: Completes semi-annually    Colonoscopy: Completed in 2021, due 2026  PSA: Due  BP Readings from Last 3 Encounters:  08/15/23 122/68  08/03/23 130/75  10/27/22 (!) 148/104         Review of Systems  Constitutional:  Negative for unexpected weight change.  HENT:  Negative for rhinorrhea.   Respiratory:  Negative for cough and shortness of breath.   Cardiovascular:  Negative for chest pain.  Gastrointestinal:  Negative for constipation and diarrhea.  Genitourinary:  Negative for difficulty urinating.  Musculoskeletal:  Positive for arthralgias. Negative for myalgias.  Skin:  Negative for rash.  Allergic/Immunologic: Negative for environmental allergies.  Neurological:  Negative for dizziness and headaches.  Psychiatric/Behavioral:  The patient is not nervous/anxious.          Past Medical History:  Diagnosis Date   Hyperlipidemia    Hypertension    Obesity     Social History   Socioeconomic History   Marital status: Married    Spouse name: Not on file   Number of children: Not on file   Years of education: Not on file   Highest education level: Not on file  Occupational History   Occupation: IT  Tobacco Use   Smoking status: Never   Smokeless tobacco: Never  Vaping Use   Vaping status: Never Used  Substance and Sexual Activity   Alcohol use: Yes    Alcohol/week: 0.0 standard drinks of alcohol    Comment: socially -  1-2 approx 3 x week   Drug use: No   Sexual activity:  Not on file  Other Topics Concern   Not on file  Social History Narrative   Married.   2 children.   Works for [employer].   Enjoys camping, fishing, being outdoors.   Social Drivers of Corporate investment banker Strain: Not on file  Food Insecurity: Not on file  Transportation Needs: Not on file  Physical Activity: Not on file  Stress: Not on file  Social Connections: Not on file  Intimate Partner Violence: Not on file    Past Surgical History:  Procedure Laterality Date   CARPAL TUNNEL RELEASE Right    COLONOSCOPY WITH PROPOFOL  N/A 05/23/2019   Procedure: COLONOSCOPY WITH PROPOFOL ;  Surgeon: Therisa Bi, MD;  Location[facility] ENDOSCOPY;  Service: Gastroenterology;  Laterality: N/A;   KNEE SURGERY Right    TRIGGER FINGER RELEASE Right     Family History  Problem Relation Age of Onset   Hypertension Mother    Colon cancer Mother    Hypertension Father    Prostate cancer Father    Diabetes Father     No Known Allergies  Current Outpatient Medications on File Prior to Visit  Medication Sig Dispense Refill   ibuprofen  (ADVIL ) 600 MG tablet Take 1 tablet 3 times a day by oral route. 60 tablet 2   losartan -hydrochlorothiazide  (HYZAAR ) 100-25 MG tablet Take 1 tablet by mouth daily. 90 tablet 0  rosuvastatin (CRESTOR) 20 MG tablet Take 1 tablet (20 mg total) by mouth at bedtime. (Patient not taking: Reported on 08/15/2023) 90 tablet 3   No current facility-administered medications on file prior to visit.    BP 122/68   Pulse 72   Temp (!) 97.2 F (36.2 C) (Temporal)   Ht 5' 5 (1.651 m)   Wt 300 lb (136.1 kg)   SpO2 99%   BMI 49.92 kg/m  Objective:   Physical Exam HENT:     Right Ear: Tympanic membrane and ear canal normal.     Left Ear: Tympanic membrane and ear canal normal.   Eyes:     Pupils: Pupils are equal, round, and reactive to light.    Cardiovascular:     Rate and Rhythm: Normal rate and regular rhythm.  Pulmonary:     Effort: Pulmonary effort is  normal.     Breath sounds: Normal breath sounds.  Abdominal:     General: Bowel sounds are normal.     Palpations: Abdomen is soft.     Tenderness: There is no abdominal tenderness.   Musculoskeletal:        General: Normal range of motion.     Cervical back: Neck supple.   Skin:    General: Skin is warm and dry.   Neurological:     Mental Status: He is alert and oriented to person, place, and time.     Cranial Nerves: No cranial nerve deficit.     Deep Tendon Reflexes:     Reflex Scores:      Patellar reflexes are 2+ on the right side and 2+ on the left side.  Psychiatric:        Mood and Affect: Mood normal.           Assessment & Plan:  Preventative health care Assessment & Plan: First Shingrix vaccine provided today, discussed instructions for second Shingrix vaccine Colonoscopy UTD, due 2026 PSA due and pending.  Discussed the importance of a healthy diet and regular exercise in order for weight loss, and to reduce the risk of further co-morbidity.  Exam stable. Labs pending.  Follow up in 1 year for repeat physical.    Prediabetes Assessment & Plan: Repeat A1c pending.  Orders: -     Hemoglobin A1c  Screening for prostate cancer -     PSA  Primary hypertension Assessment & Plan: Controlled.  Continue losartan -hydrochlorothiazide  100-25 mg daily. Reviewed BMP from June 2025 per cardiology   Hyperlipidemia, unspecified hyperlipidemia type Assessment & Plan: Discussed purpose for statin therapy as he has several questions today. We discussed his ASCVD risk for future heart disease. Reviewed coronary CT scan from 2024 which showed a calcium score of 0.  He has decided to defer treatment with rosuvastatin. He will work on lifestyle changes.         Aubria Vanecek K Clarene Curran, NP

## 2023-08-15 NOTE — Assessment & Plan Note (Signed)
 Controlled.  Continue losartan -hydrochlorothiazide  100-25 mg daily. Reviewed BMP from June 2025 per cardiology

## 2023-08-15 NOTE — Patient Instructions (Signed)
 Stop by the lab prior to leaving today. I will notify you of your results once received.   Schedule a nurse visit to receive your second shingles shot in 2 to 6 months.  It was a pleasure to see you today!

## 2023-08-15 NOTE — Assessment & Plan Note (Signed)
 Repeat A1c pending

## 2023-08-17 ENCOUNTER — Other Ambulatory Visit: Payer: Self-pay

## 2023-08-30 ENCOUNTER — Other Ambulatory Visit: Payer: Self-pay

## 2023-11-24 ENCOUNTER — Other Ambulatory Visit: Payer: Self-pay | Admitting: Cardiology

## 2023-11-24 ENCOUNTER — Other Ambulatory Visit: Payer: Self-pay

## 2023-11-24 MED FILL — Losartan Potassium & Hydrochlorothiazide Tab 100-25 MG: ORAL | 90 days supply | Qty: 90 | Fill #0 | Status: AC

## 2024-03-06 MED FILL — Losartan Potassium & Hydrochlorothiazide Tab 100-25 MG: ORAL | 90 days supply | Qty: 90 | Fill #1 | Status: AC
